# Patient Record
Sex: Female | Born: 1935 | Race: White | Hispanic: No | Smoking: Former smoker
Health system: Southern US, Community
[De-identification: ages and names within clinical notes are randomized; demographics above are authoritative.]

## PROBLEM LIST (undated history)

## (undated) DIAGNOSIS — F329 Major depressive disorder, single episode, unspecified: Secondary | ICD-10-CM

## (undated) DIAGNOSIS — M1990 Unspecified osteoarthritis, unspecified site: Secondary | ICD-10-CM

## (undated) DIAGNOSIS — G250 Essential tremor: Secondary | ICD-10-CM

## (undated) DIAGNOSIS — F3289 Other specified depressive episodes: Secondary | ICD-10-CM

## (undated) DIAGNOSIS — R269 Unspecified abnormalities of gait and mobility: Secondary | ICD-10-CM

## (undated) DIAGNOSIS — M810 Age-related osteoporosis without current pathological fracture: Secondary | ICD-10-CM

## (undated) DIAGNOSIS — J479 Bronchiectasis, uncomplicated: Secondary | ICD-10-CM

## (undated) DIAGNOSIS — S22000A Wedge compression fracture of unspecified thoracic vertebra, initial encounter for closed fracture: Secondary | ICD-10-CM

## (undated) DIAGNOSIS — G252 Other specified forms of tremor: Principal | ICD-10-CM

## (undated) DIAGNOSIS — C801 Malignant (primary) neoplasm, unspecified: Secondary | ICD-10-CM

## (undated) DIAGNOSIS — S72142A Displaced intertrochanteric fracture of left femur, initial encounter for closed fracture: Secondary | ICD-10-CM

## (undated) HISTORY — DX: Bronchiectasis, uncomplicated: J47.9

## (undated) HISTORY — PX: TONSILLECTOMY: SUR1361

## (undated) HISTORY — DX: Unspecified abnormalities of gait and mobility: R26.9

## (undated) HISTORY — DX: Malignant (primary) neoplasm, unspecified: C80.1

## (undated) HISTORY — DX: Major depressive disorder, single episode, unspecified: F32.9

## (undated) HISTORY — PX: OTHER SURGICAL HISTORY: SHX169

## (undated) HISTORY — PX: CATARACT EXTRACTION: SUR2

## (undated) HISTORY — PX: BREAST BIOPSY: SHX20

## (undated) HISTORY — DX: Other specified forms of tremor: G25.2

## (undated) HISTORY — PX: CHOLECYSTECTOMY: SHX55

## (undated) HISTORY — PX: TOTAL ABDOMINAL HYSTERECTOMY W/ BILATERAL SALPINGOOPHORECTOMY: SHX83

## (undated) HISTORY — DX: Unspecified osteoarthritis, unspecified site: M19.90

## (undated) HISTORY — DX: Age-related osteoporosis without current pathological fracture: M81.0

## (undated) HISTORY — PX: APPENDECTOMY: SHX54

## (undated) HISTORY — DX: Essential tremor: G25.0

## (undated) HISTORY — PX: MOHS SURGERY: SUR867

## (undated) HISTORY — DX: Other specified depressive episodes: F32.89

---

## 2002-08-10 ENCOUNTER — Encounter: Admission: RE | Admit: 2002-08-10 | Discharge: 2002-08-10 | Payer: Self-pay | Admitting: Geriatric Medicine

## 2002-08-10 ENCOUNTER — Encounter: Payer: Self-pay | Admitting: Geriatric Medicine

## 2002-11-02 ENCOUNTER — Encounter: Admission: RE | Admit: 2002-11-02 | Discharge: 2002-11-02 | Payer: Self-pay | Admitting: Geriatric Medicine

## 2002-11-02 ENCOUNTER — Encounter: Payer: Self-pay | Admitting: Geriatric Medicine

## 2003-08-13 ENCOUNTER — Encounter: Admission: RE | Admit: 2003-08-13 | Discharge: 2003-08-13 | Payer: Self-pay | Admitting: Geriatric Medicine

## 2003-12-11 ENCOUNTER — Encounter: Admission: RE | Admit: 2003-12-11 | Discharge: 2003-12-11 | Payer: Self-pay | Admitting: Geriatric Medicine

## 2004-08-13 ENCOUNTER — Encounter: Admission: RE | Admit: 2004-08-13 | Discharge: 2004-08-13 | Payer: Self-pay | Admitting: Geriatric Medicine

## 2004-10-02 ENCOUNTER — Encounter: Admission: RE | Admit: 2004-10-02 | Discharge: 2004-10-02 | Payer: Self-pay | Admitting: Geriatric Medicine

## 2005-02-02 ENCOUNTER — Encounter: Admission: RE | Admit: 2005-02-02 | Discharge: 2005-02-02 | Payer: Self-pay | Admitting: Neurology

## 2005-09-02 ENCOUNTER — Encounter: Admission: RE | Admit: 2005-09-02 | Discharge: 2005-09-02 | Payer: Self-pay | Admitting: Geriatric Medicine

## 2005-10-13 ENCOUNTER — Encounter: Admission: RE | Admit: 2005-10-13 | Discharge: 2005-10-13 | Payer: Self-pay | Admitting: Geriatric Medicine

## 2006-06-20 ENCOUNTER — Encounter: Admission: RE | Admit: 2006-06-20 | Discharge: 2006-06-20 | Payer: Self-pay | Admitting: Geriatric Medicine

## 2006-09-06 ENCOUNTER — Encounter: Admission: RE | Admit: 2006-09-06 | Discharge: 2006-09-06 | Payer: Self-pay | Admitting: Geriatric Medicine

## 2006-09-27 ENCOUNTER — Encounter (INDEPENDENT_AMBULATORY_CARE_PROVIDER_SITE_OTHER): Payer: Self-pay | Admitting: Cardiology

## 2006-09-27 ENCOUNTER — Ambulatory Visit (HOSPITAL_COMMUNITY): Admission: RE | Admit: 2006-09-27 | Discharge: 2006-09-27 | Payer: Self-pay | Admitting: Geriatric Medicine

## 2006-10-05 ENCOUNTER — Ambulatory Visit (HOSPITAL_COMMUNITY): Admission: RE | Admit: 2006-10-05 | Discharge: 2006-10-05 | Payer: Self-pay | Admitting: Interventional Cardiology

## 2007-09-01 ENCOUNTER — Encounter: Admission: RE | Admit: 2007-09-01 | Discharge: 2007-09-01 | Payer: Self-pay | Admitting: Geriatric Medicine

## 2007-09-07 ENCOUNTER — Encounter: Admission: RE | Admit: 2007-09-07 | Discharge: 2007-09-07 | Payer: Self-pay | Admitting: Geriatric Medicine

## 2007-09-13 ENCOUNTER — Encounter: Admission: RE | Admit: 2007-09-13 | Discharge: 2007-09-13 | Payer: Self-pay | Admitting: Geriatric Medicine

## 2008-01-11 ENCOUNTER — Encounter: Admission: RE | Admit: 2008-01-11 | Discharge: 2008-01-11 | Payer: Self-pay | Admitting: Geriatric Medicine

## 2008-01-15 ENCOUNTER — Encounter: Admission: RE | Admit: 2008-01-15 | Discharge: 2008-01-15 | Payer: Self-pay | Admitting: Geriatric Medicine

## 2008-03-12 ENCOUNTER — Encounter: Admission: RE | Admit: 2008-03-12 | Discharge: 2008-03-12 | Payer: Self-pay | Admitting: Geriatric Medicine

## 2008-04-26 ENCOUNTER — Encounter: Admission: RE | Admit: 2008-04-26 | Discharge: 2008-04-26 | Payer: Self-pay | Admitting: Geriatric Medicine

## 2008-05-08 ENCOUNTER — Encounter: Payer: Self-pay | Admitting: Internal Medicine

## 2008-05-09 ENCOUNTER — Encounter: Payer: Self-pay | Admitting: Internal Medicine

## 2008-05-10 ENCOUNTER — Encounter: Payer: Self-pay | Admitting: Internal Medicine

## 2008-07-25 ENCOUNTER — Ambulatory Visit: Payer: Self-pay | Admitting: Internal Medicine

## 2008-07-25 DIAGNOSIS — R930 Abnormal findings on diagnostic imaging of skull and head, not elsewhere classified: Secondary | ICD-10-CM

## 2008-07-25 DIAGNOSIS — J479 Bronchiectasis, uncomplicated: Secondary | ICD-10-CM

## 2008-08-05 ENCOUNTER — Encounter: Payer: Self-pay | Admitting: Internal Medicine

## 2008-08-11 DIAGNOSIS — F329 Major depressive disorder, single episode, unspecified: Secondary | ICD-10-CM

## 2008-08-11 DIAGNOSIS — M810 Age-related osteoporosis without current pathological fracture: Secondary | ICD-10-CM | POA: Insufficient documentation

## 2008-08-15 ENCOUNTER — Ambulatory Visit: Payer: Self-pay | Admitting: Internal Medicine

## 2008-09-10 ENCOUNTER — Encounter: Admission: RE | Admit: 2008-09-10 | Discharge: 2008-09-10 | Payer: Self-pay | Admitting: Geriatric Medicine

## 2008-09-23 ENCOUNTER — Ambulatory Visit: Payer: Self-pay | Admitting: Internal Medicine

## 2008-09-25 ENCOUNTER — Encounter: Admission: RE | Admit: 2008-09-25 | Discharge: 2008-09-25 | Payer: Self-pay | Admitting: Geriatric Medicine

## 2008-10-01 ENCOUNTER — Encounter: Admission: RE | Admit: 2008-10-01 | Discharge: 2008-10-01 | Payer: Self-pay | Admitting: Geriatric Medicine

## 2008-10-10 ENCOUNTER — Encounter: Payer: Self-pay | Admitting: Internal Medicine

## 2008-10-10 ENCOUNTER — Ambulatory Visit (HOSPITAL_COMMUNITY): Admission: RE | Admit: 2008-10-10 | Discharge: 2008-10-10 | Payer: Self-pay | Admitting: Internal Medicine

## 2008-10-31 ENCOUNTER — Ambulatory Visit: Payer: Self-pay | Admitting: Internal Medicine

## 2009-03-06 ENCOUNTER — Ambulatory Visit: Payer: Self-pay | Admitting: Internal Medicine

## 2009-03-27 ENCOUNTER — Encounter: Admission: RE | Admit: 2009-03-27 | Discharge: 2009-03-27 | Payer: Self-pay | Admitting: Geriatric Medicine

## 2009-07-31 ENCOUNTER — Ambulatory Visit: Payer: Self-pay | Admitting: Internal Medicine

## 2009-08-23 ENCOUNTER — Emergency Department (HOSPITAL_COMMUNITY): Admission: EM | Admit: 2009-08-23 | Discharge: 2009-08-23 | Payer: Self-pay | Admitting: Emergency Medicine

## 2009-08-27 ENCOUNTER — Encounter: Admission: RE | Admit: 2009-08-27 | Discharge: 2009-08-27 | Payer: Self-pay | Admitting: Geriatric Medicine

## 2009-09-01 ENCOUNTER — Encounter: Payer: Self-pay | Admitting: Internal Medicine

## 2009-09-05 ENCOUNTER — Telehealth (INDEPENDENT_AMBULATORY_CARE_PROVIDER_SITE_OTHER): Payer: Self-pay | Admitting: *Deleted

## 2009-09-11 ENCOUNTER — Encounter: Admission: RE | Admit: 2009-09-11 | Discharge: 2009-09-11 | Payer: Self-pay | Admitting: Geriatric Medicine

## 2009-12-29 IMAGING — CT CT CHEST W/O CM
4 of 7 series · 16 of 36 positions shown, 18 images · non-contrast
Comparison: Chest CTs 04/26/2008 and 01/15/2008.

CLINICAL DATA: Shortness of breath with exertion and chronic cough.
Follow-up bronchiectasis.

CT CHEST WITHOUT CONTRAST
TECHNIQUE: Multidetector CT imaging of the chest was performed
following the standard protocol without IV contrast.

[Series 2: hi-res 5mm · axial · 0.62mm/px · z∈[-277,-77]mm · 6 of 58 slices shown, 8 images]
[im 9/58  mediastinal]
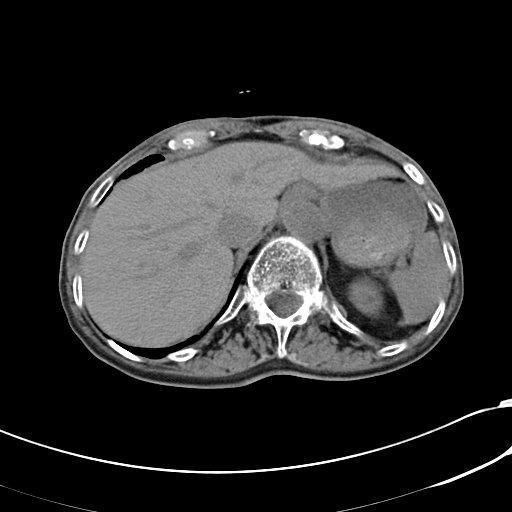
[im 9/58  lung]
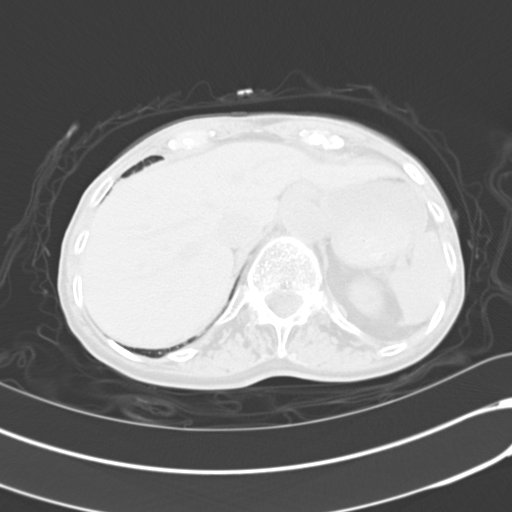
[im 17/58  lung]
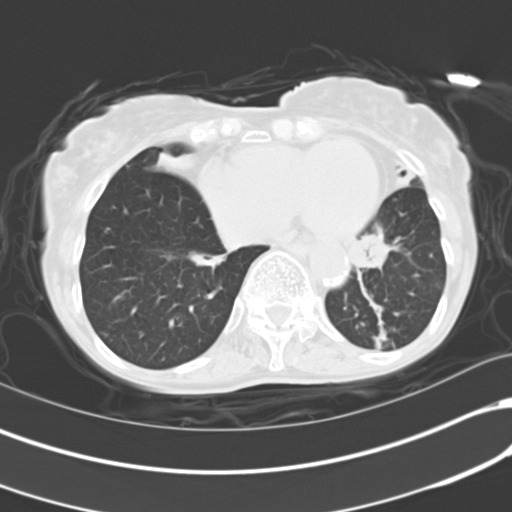
[im 25/58  lung]
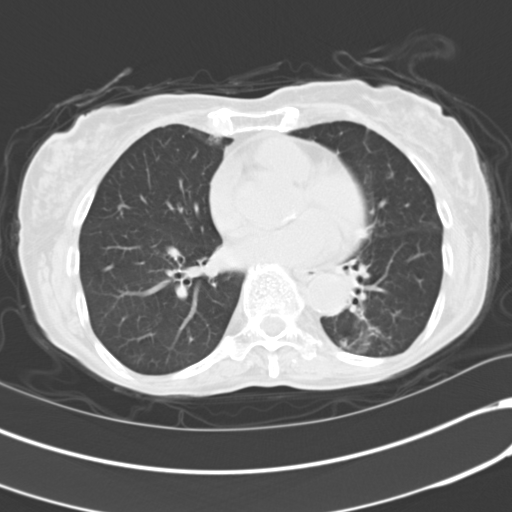
[im 33/58  lung]
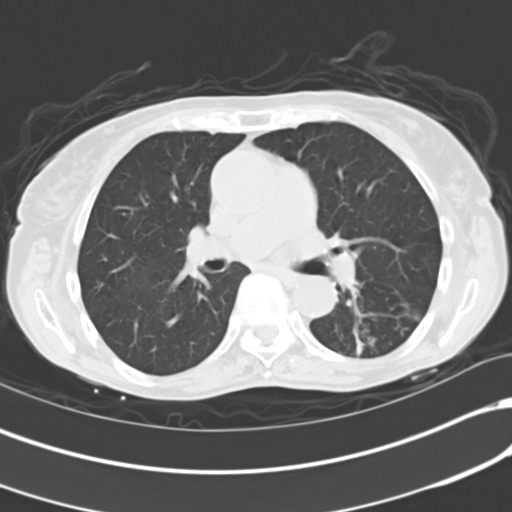
[im 41/58  mediastinal]
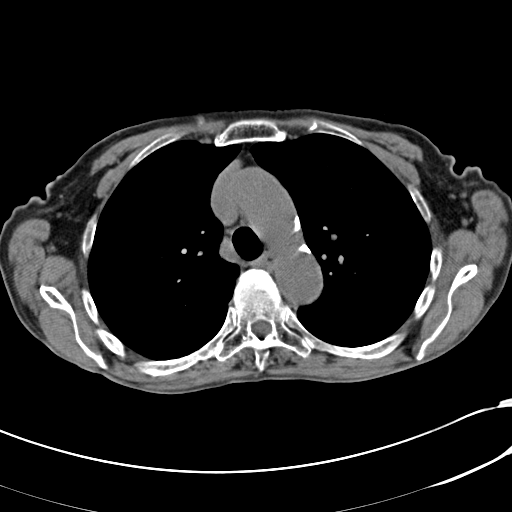
[im 41/58  lung]
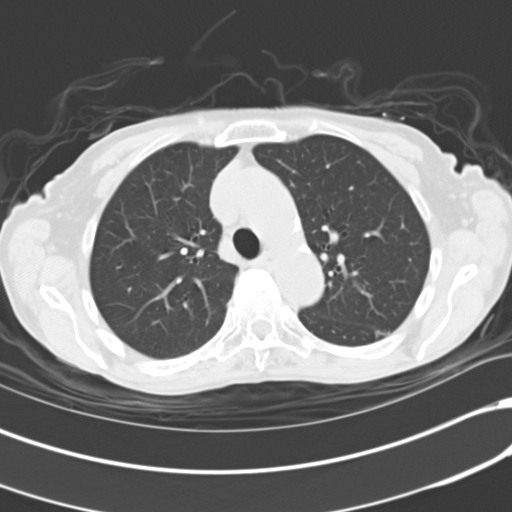
[im 49/58  lung]
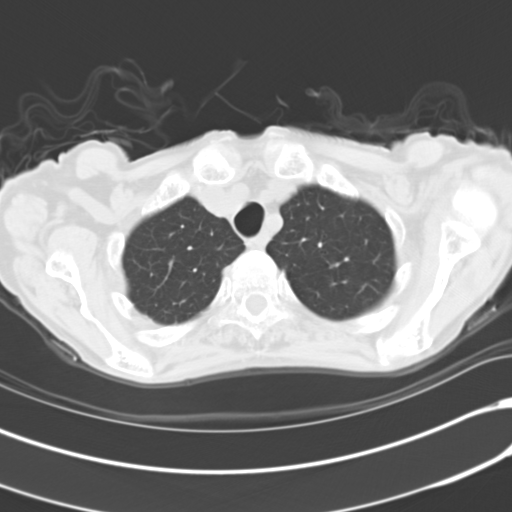

[Series 5: lung · axial · 0.62mm/px · z∈[-272,-72]mm · 6 of 56 slices shown]
[im 8/56  lung]
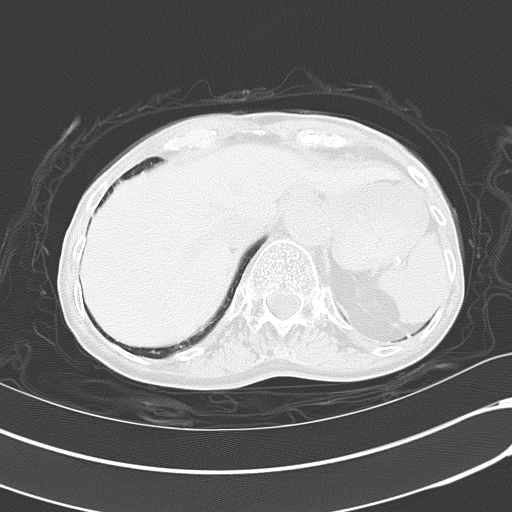
[im 16/56  lung]
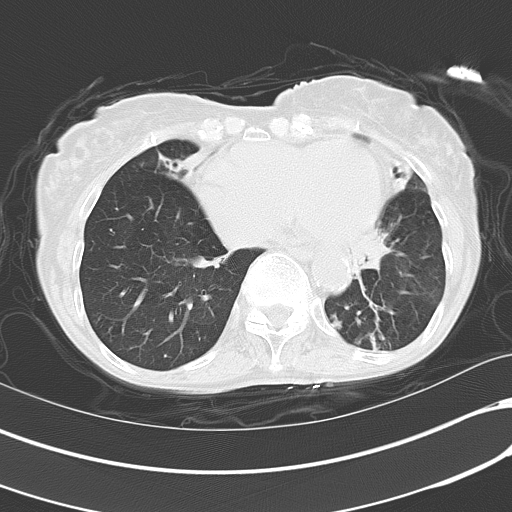
[im 24/56  lung]
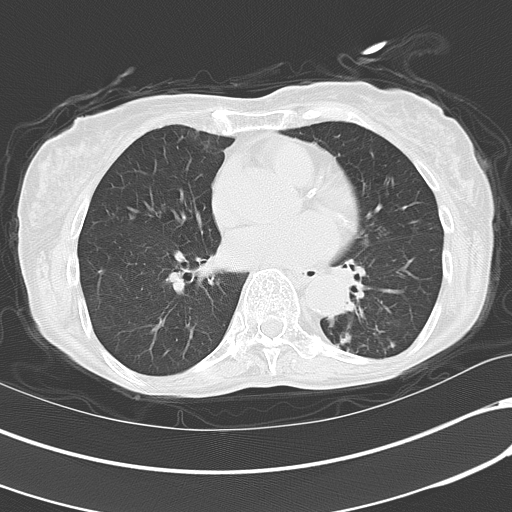
[im 32/56  lung]
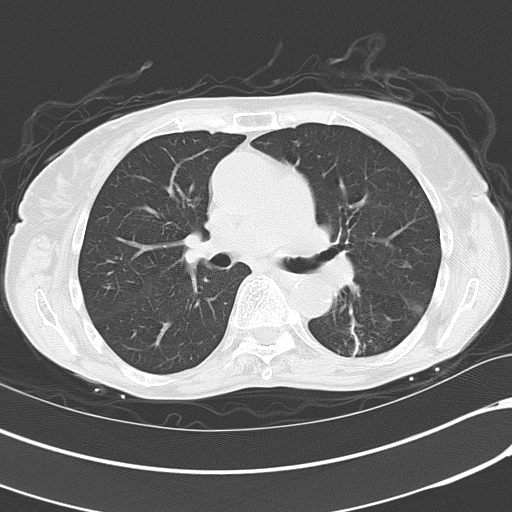
[im 40/56  lung]
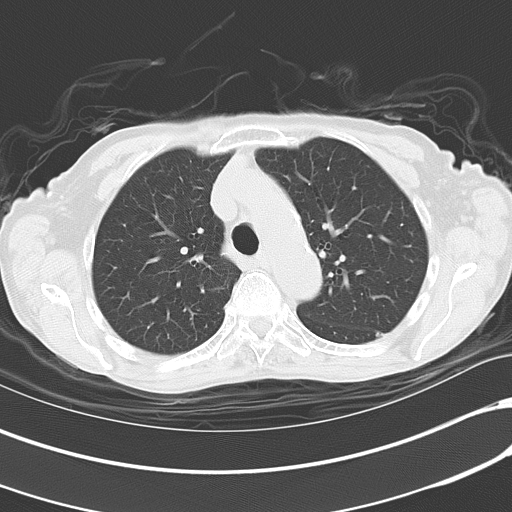
[im 48/56  lung]
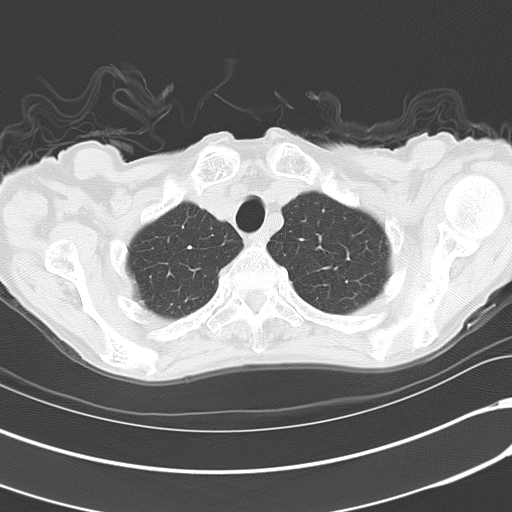

[Series 6: high res lung 1x5 · axial · 0.62mm/px · 1 of 58 slices shown]
[im 9/58  lung]
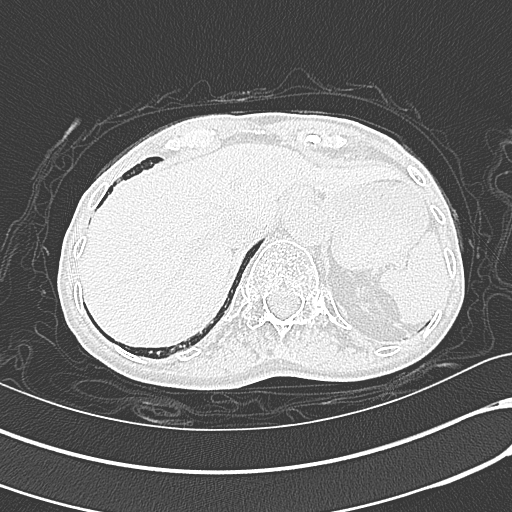

[Series 602: <mpr thick range> · coronal · 0.62mm/px · 3 of 96 slices shown]
[im 20/96  lung]
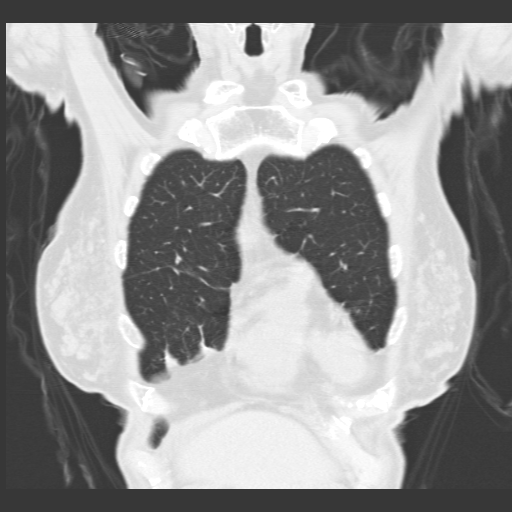
[im 39/96  lung]
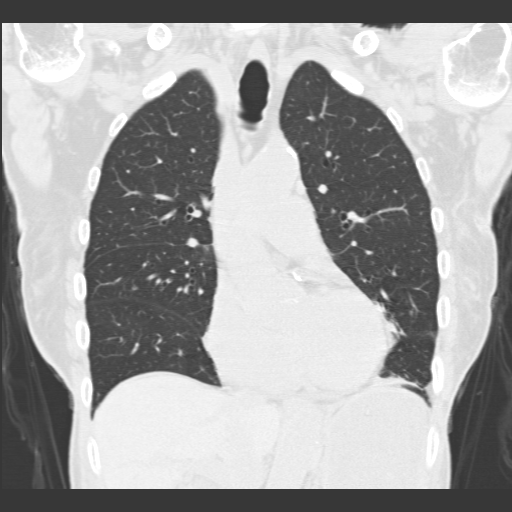
[im 58/96  lung]
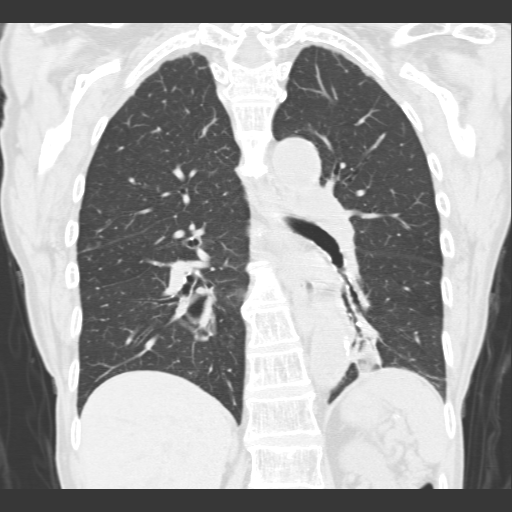

[16 of 36 positions shown; findings below may reference images not displayed]

FINDINGS: There are no enlarged mediastinal or hilar lymph nodes.
There is no pleural or pericardial effusion.  Coronary artery
disease and central enlargement of the pulmonary arteries are again
noted.

Thyroid heterogeneity and small calcifications in the right lobe
are unchanged.  There is stable tortuosity and medial displacement
of the carotid arteries at the thoracic inlet.

Bronchiectasis in the right middle lobe, lingula and left lower
lobe is again noted.  Compared with the prior study, no changes are
identified on the right.  However, there is progressive left lower
lobe bronchiectasis and surrounding inflammatory change, specially
inferior to the hilum and lateral to the descending aorta (axial
images 39-44).  A parenchymal calcification in the left lower lobe
on image 37 is unchanged from prior study, although better
visualized on the current examination due to the lack of
intravenous contrast.  This does not appear to reflect an aspirated
broncholith based on stability.

Thin section images obtained with inspiration and expiration
demonstrate scattered areas of air trapping.

There are no acute or significant findings in the upper abdomen.
IMPRESSION: 1.  Progressive bronchiectasis and peribronchial inflammatory
change in the left lower lobe.  As suggested previously, the
findings are suspicious for chronic infection, possibly atypical
mycobacterial infection.  There are scattered areas of air
trapping.
2.  The bronchiectasis and scarring in the right middle lobe and
lingula appear unchanged.
3.  No adenopathy or pleural effusion.
4.  Stable changes of pulmonary arterial hypertension.

## 2010-01-16 ENCOUNTER — Emergency Department (HOSPITAL_COMMUNITY): Admission: EM | Admit: 2010-01-16 | Discharge: 2010-01-16 | Payer: Self-pay | Admitting: Emergency Medicine

## 2010-01-29 ENCOUNTER — Encounter: Admission: RE | Admit: 2010-01-29 | Discharge: 2010-01-29 | Payer: Self-pay | Admitting: Internal Medicine

## 2010-01-29 ENCOUNTER — Ambulatory Visit: Payer: Self-pay | Admitting: Internal Medicine

## 2010-06-01 ENCOUNTER — Telehealth: Payer: Self-pay | Admitting: Internal Medicine

## 2010-06-10 ENCOUNTER — Ambulatory Visit: Payer: Self-pay | Admitting: Internal Medicine

## 2010-07-19 ENCOUNTER — Encounter: Payer: Self-pay | Admitting: Geriatric Medicine

## 2010-07-28 NOTE — Progress Notes (Signed)
Summary: Cough-requesting appointment  Phone Note Call from Patient Call back at [phone]   Caller: Daughter-Kay Call For: Young Reason for Call: Talk to Nurse Summary of Call: Kay-c/o pt having productive cough with foamy white mucus and is becoming harder for pt to bring it up. Requesting an appointment with CY, Thursdays are better for the pt, okay to leave message on voicemail. Offered appointment Jul 03, 2010 @ 9:15am but would like an appointment before then.  Initial call taken by: Zackery Barefoot CMA,  June 01, 2010 9:57 AM  Follow-up for Phone Call        [facility] Vernie Murders  June 01, 2010 10:28 AM  spoke with Joyce Gross and pt set to see CY on 06-10-10 at 10:45. Carron Curie CMA  June 01, 2010 11:46 AM

## 2010-07-28 NOTE — Letter (Signed)
Summary: [facility] Results Follow Up Letter  [facility]  [address]   [place], [place] [address]   Phone: [phone]  Fax: [phone]    09/01/2009 MRN: [id number]  [facility] [address] [place], [place]  [address]  Dear Ms. Beer,  We have received the results from your recent tests and have been unable to contact you.  Please call our office at [phone] so that Dr.Alexa Golebiewski or his nurse may review the results with you.    Thank you,      Nature conservation officer Pulmonary Division

## 2010-07-28 NOTE — Assessment & Plan Note (Signed)
Summary: 6 months/apc   Copy to:  Dr Pete Glatter Primary Provider/Referring Provider:  Pete Glatter  CC:  Follow up visit-gives out when walking long distances..  History of Present Illness: 03/06/09- Bronchieictasis, hesitant dysphonia, abnl cxr.......................daughter here. Only one hearing aid today so daughter again does much of the talking. One or two episodes of bad choking with a straw, so not using one. Denies fever or wake from sleep with cough. Daily cough with some white foam or trace yellow. Easily dypneic if she hurries, but this is not changed. She has had at least one pneumovax. Will get flu vax where she stays.  July 31, 2009- Bronchiectasis, hesitant dysphonia, abnl cxr.........................daughter here Got flu vax. Denies acute issues.They note stable or slightly worse exertional dyspnea, but deny waking at night short of breath or with cough or choke. No night sweats and little cough or phlegm. She paces herself. She complains of her weakness.  CT in 2/ 2010 showed PHTN, inflammatory scarring (MAIC?) in LLL. Daughter spoke to me aside to say they have worked with Dr Pete Glatter. Mom is depressed and being treated. Daughter agrees with conservative approach and thinks Mom is basically doing ok for age.  January 29, 2010- Bronchiectasis, hesitant dysphonia, abnl CXR........daughter here Feel 2 weeks ago- treated for laceration R browline. Did not hurt her chest. Morning cough white phlegm, then clear for day. Easy DOE walking, but she and her daughter feel there has been little change since last here. They realize the heat drains her some also.  To see her primary doctor today because right ankle is swelling a bit without pain. She did hurt right knee when she fell. She is careful to swallow well, but denies choking with meal or dirnk CXR in February showed no acvtive process. Known bronchiectasis.  Preventive Screening-Counseling & Management  Alcohol-Tobacco  Smoking Status: quit     Packs/Day: <0.25     Year Started: teenager     Year Quit: remote  Current Medications (verified): 1)  Aspirin Adult Low Strength 81 Mg Tbec (Aspirin) .Marland Kitchen.. 1 Once Daily 2)  Elder Tonic .... Once Daily By Mouth 3)  Vitamin C Cr 500 Mg Cr-Caps (Ascorbic Acid) .Marland Kitchen.. 1 Once Daily 4)  Calcium 600 1500 Mg Tabs (Calcium Carbonate) .... Take 1 Tablet By Mouth Two Times A Day 5)  Remeron 15 Mg Tabs (Mirtazapine) .... Take 1/2 By Mouth At Bedtime 6)  Alprazolam 0.25 Mg Tabs (Alprazolam) .... Take 1 By Mouth Three Times A Day As Needed  Allergies (verified): 1)  ! Codeine 2)  ! * Urecholine  Past History:  Past Medical History: Last updated: 07/25/2008 BRONCHIECTASIS (ICD-494.0) CHEST XRAY, ABNORMAL (ICD-793.1) OSTEOPOROSIS (ICD-733.00) DEPRESSION (ICD-311)  Past Surgical History: Last updated: 07/25/2008 Cataract Moh's surgery breast biopsy- B9 Right rotator cuff T A H and B S O Cholecystectomy Appendectomy hx left wrist fracture  Family History: Last updated: 07/25/2008 Father- emphysema Mother- heart Sister- cancer  Social History: Last updated: 07/25/2008 Patient states former smoker.  Lives [facility] [place] [facility]  Risk Factors: Smoking Status: quit (01/29/2010) Packs/Day: <0.25 (01/29/2010)  Social History: Packs/Day:  <0.25  Review of Systems      See HPI       The patient complains of shortness of breath with activity and productive cough.  The patient denies shortness of breath at rest, non-productive cough, coughing up blood, chest pain, irregular heartbeats, acid heartburn, indigestion, loss of appetite, weight change, abdominal pain, difficulty swallowing, sore throat, tooth/dental problems, headaches,  nasal congestion/difficulty breathing through nose, and sneezing.    Vital Signs:  Patient profile:   77 year old female Height:      65 inches Weight:      97 pounds BMI:     16.20 O2 Sat:      94 % on Room air Pulse  rate:   83 / minute BP sitting:   100 / 62  (left arm) Cuff size:   regular  Vitals Entered By: Reynaldo Minium CMA (January 29, 2010 10:37 AM)  O2 Flow:  Room air CC: Follow up visit-gives out when walking long distances.   Physical Exam  Additional Exam:  General: A/Ox3; pleasant and cooperative, NAD, elderly , thin, q SKIN: no rash, lesions NODES: no lymphadenopathy HEENT: [place]/AT, EOM- WNL, Conjuctivae- clear, PERRLA, TM-hearing aids, Nose- clear, Throat- clear and wnl, own teeth, Mallampati II, no postnasal drip. No stridor. NECK: Supple w/ fair ROM, JVD- none, normal carotid impulses w/o bruits Thyroid-  CHEST-Wet ssqueeks in bases, without cough, no dullness or rub, unlabored. Sat 94% on room air.Marland Kitchen HEART: RRR, no m/g/r heard ABDOMEN: Thin URK:YHCW, nl pulses, no edema, cyanosis or clubbing. NEURO: resting tremor with "tight" hesitant vocal quality, no stridor      CXR  Procedure date:  07/31/2009  Findings:        CHEST - 2 VIEW   Comparison: CT examination 08/15/2008.  Radiographic examination 01/11/2008.   Findings: Previous CT examination is demonstrated bronchiectasis. Linear fibrotic changes are seen in the left lower lobe.  There is some central peribronchial thickening.  Cardiac density is minimally enlarged. Ectasia and nonaneurysmal calcification of the thoracic aorta is seen. There is a mildly osteopenic appearance of the bones. On the PA image nipple densities project over the bases. There is overall hyperinflation configuration.   IMPRESSION: Previous CT examination demonstrated bronchiectasis.  There is central peribronchial thickening.  Pulmonary fibrotic changes appear stable.  No acute superimposed process is identified.  There is generalized hyperinflation configuration.   Read By:  Crawford Givens,  M.D.     Released By:  Crawford Givens,  M.D.   Impression & Recommendations:  Problem # 1:  BRONCHIECTASIS (ICD-494.0) Clinically stable, but  definite bronchiectasis. i think we can wait on CXR unless they notice a change. She is not taking respiratory meds or oxygen.   Problem # 2:  CHEST XRAY, ABNORMAL (ICD-793.1)  Known bronchiectasis with no other concern She is at some potential risk for aspiration due to age and weakness.  Had pneumovax in 2010  Medications Added to Medication List This Visit: 1)  Alprazolam 0.25 Mg Tabs (Alprazolam) .... Take 1 by mouth three times a day as needed  Other Orders: Est. Patient Level III (23762)  Patient Instructions: 1)  Please schedule a follow-up appointment in 6 months.     CXR  Procedure date:  07/31/2009  Findings:        CHEST - 2 VIEW   Comparison: CT examination 08/15/2008.  Radiographic examination 01/11/2008.   Findings: Previous CT examination is demonstrated bronchiectasis. Linear fibrotic changes are seen in the left lower lobe.  There is some central peribronchial thickening.  Cardiac density is minimally enlarged. Ectasia and nonaneurysmal calcification of the thoracic aorta is seen. There is a mildly osteopenic appearance of the bones. On the PA image nipple densities project over the bases. There is overall hyperinflation configuration.   IMPRESSION: Previous CT examination demonstrated bronchiectasis.  There is central peribronchial thickening.  Pulmonary fibrotic changes  appear stable.  No acute superimposed process is identified.  There is generalized hyperinflation configuration.   Read By:  Crawford Givens,  M.D.     Released By:  Crawford Givens,  M.D.

## 2010-07-28 NOTE — Progress Notes (Signed)
Summary: returning call for results  Phone Note Call from Patient Call back at [phone]   Caller: kay (helens daughter) Call For: young Summary of Call: patients daughter is calling because she got a letter in the mail saying that we have been trying call with results. Ms Blattner is there at her house also.  Initial call taken by: Valinda Hoar,  September 05, 2009 12:57 PM  Follow-up for Phone Call        Spoke with pt's daughter and made aware of results per cxr append.  Follow-up by: Vernie Murders,  September 05, 2009 1:51 PM

## 2010-07-28 NOTE — Assessment & Plan Note (Signed)
Summary: 4 months/apc   Copy to:  Dr Pete Glatter Primary Provider/Referring Provider:  Pete Glatter  CC:  Follow up visit-? CXR needed.Marland Kitchen  History of Present Illness: 09/23/08- Bronchiectasis, abnl cxr           Daughter does most of talking Discussed CT- Some progression over the past year of bronchiectatic changes esp in left lower lobe. All sputum specimens from [place] are neg for AFB. She says she coughs "a little".milky thick stuff.  Mostly morning cough hard to clear sense of something in her throat but once clear, she denies cough or choke with meals. Does sometimes get strangled.  10/31/08- Bronchiectasis, hesitant dysphonia    Daughter here Discussed swallowing evaluation suggesting some esophageal dysmotility and lack of awareness of pill hangup, but with low risk of aspiration. Daughter says Mom does eat slowly, small meals/ Ensure, and they show good understanding and compliance with speech therapy recommendations.Gets strangled occasionally.  03/06/09- Bronchieictasis, hesitant dysphonia, abnl cxr.......................daughter here. Only one hearing aid today so daughter again does much of the talking. One or two episodes of bad choking with a straw, so not using one. Denies fever or wake from sleep with cough. Daily cough with some white foam or trace yellow. Easily dypneic if she hurries, but this is not changed. She has had at least one pneumovax. Will get flu vax where she stays.  July 31, 2009- Bronchiectasis, hesitant dysphonia, abnl cxr.........................daughter here Got flu vax. Denies acute issues.They note stable or slightly worse exertional dyspnea, but deny waking at night short of breath or with cough or choke. No night sweats and little cough or phlegm. She paces herself. She complains of her weakness.  CT in 2/ 2010 showed PHTN, inflammatory scarring (MAIC?) in LLL. Daughter spoke to me aside to say they have worked with Dr Pete Glatter. Mom is depressed and being  treated. Daughter agrees with conservative approach and thinks Mom is basically doing ok for age.    Current Medications (verified): 1)  Topiramate 25 Mg Tabs (Topiramate) .... Take 1 By Mouth Every Morning and Take 2 By Mouth At Bedtime 2)  Aspirin Adult Low Strength 81 Mg Tbec (Aspirin) .Marland Kitchen.. 1 Once Daily 3)  Elder Tonic .... Once Daily By Mouth 4)  Vitamin C Cr 500 Mg Cr-Caps (Ascorbic Acid) .Marland Kitchen.. 1 Once Daily 5)  Calcium 600 1500 Mg Tabs (Calcium Carbonate) .... Take 1 Tablet By Mouth Two Times A Day 6)  Benzonatate 100 Mg Caps (Benzonatate) .Marland Kitchen.. 1 Every 6 Hours If Needed For Cough 7)  Remeron 15 Mg Tabs (Mirtazapine) .... Take 1/2 By Mouth At Bedtime  Allergies (verified): 1)  ! Codeine 2)  ! * Urecholine  Past History:  Past Medical History: Last updated: 07/25/2008 BRONCHIECTASIS (ICD-494.0) CHEST XRAY, ABNORMAL (ICD-793.1) OSTEOPOROSIS (ICD-733.00) DEPRESSION (ICD-311)  Past Surgical History: Last updated: 07/25/2008 Cataract Moh's surgery breast biopsy- B9 Right rotator cuff T A H and B S O Cholecystectomy Appendectomy hx left wrist fracture  Family History: Last updated: 07/25/2008 Father- emphysema Mother- heart Sister- cancer  Social History: Last updated: 07/25/2008 Patient states former smoker.  Lives [facility] [place] [facility]  Risk Factors: Smoking Status: quit (07/25/2008)  Review of Systems      See HPI       The patient complains of dyspnea on exertion.  The patient denies anorexia, fever, weight loss, weight gain, vision loss, decreased hearing, hoarseness, chest pain, syncope, peripheral edema, prolonged cough, headaches, hemoptysis, abdominal pain, and severe indigestion/heartburn.    Vital  Signs:  Patient profile:   77 year old female Height:      65 inches Weight:      101.50 pounds O2 Sat:      94 % on Room air Pulse rate:   70 / minute BP sitting:   120 / 70  (left arm) Cuff size:   regular  Vitals Entered By: Reynaldo Minium  CMA (July 31, 2009 10:06 AM)  O2 Flow:  Room air  Physical Exam  Additional Exam:  General: A/Ox3; pleasant and cooperative, NAD, elderly , thin, q SKIN: no rash, lesions NODES: no lymphadenopathy HEENT: [place]/AT, EOM- WNL, Conjuctivae- clear, PERRLA, TM-hearing aids, Nose- clear, Throat- clear and wnl, own teeth, Melampatti II, no postnasal drip. No stridor. NECK: Supple w/ fair ROM, JVD- none, normal carotid impulses w/o bruits Thyroid-  CHEST-Limited inspriatory squeeks left lower zone, without cough, no dullness or rub, unlabored. Sat 94% on room air.Marland Kitchen HEART: RRR, no m/g/r heard ABDOMEN:  ZOX:WRUE, nl pulses, no edema, cyanosis or clubbing. NEURO: resting tremor with "tight" hesitant vocal quality, no stridor      Impression & Recommendations:  Problem # 1:  BRONCHIECTASIS (ICD-494.0) Recurrent aspiration and MAIC are both possible. At age 16 we agreed that conservative management is best. We will recheck CXR, but I don't anticipate trying to treat PHTN. We are going to wait on overnight oximetry. I discussed exercise as necessary to maintain strength, balanced with eating enough to maintain weight. She expressed fear that any exercise would just cause weight loss. I tried to be gently supportive/ encouraging.  Problem # 2:  CHEST XRAY, ABNORMAL (ICD-793.1) We are updating CXR  Medications Added to Medication List This Visit: 1)  Elder Tonic  .... Once daily by mouth 2)  Remeron 15 Mg Tabs (Mirtazapine) .... Take 1/2 by mouth at bedtime  Other Orders: Est. Patient Level III (45409) T-2 View CXR (71020TC)  Patient Instructions: 1)  Please schedule a follow-up appointment in 6 months. 2)  A chest x-ray has been recommended.  Your imaging study may require preauthorization.

## 2010-07-30 ENCOUNTER — Ambulatory Visit: Payer: Self-pay | Admitting: Internal Medicine

## 2010-07-30 ENCOUNTER — Ambulatory Visit: Admit: 2010-07-30 | Payer: Self-pay | Admitting: Internal Medicine

## 2010-07-30 NOTE — Assessment & Plan Note (Signed)
Summary: increased cough and congestion//jrc   Copy to:  Dr Pete Glatter Primary Provider/Referring Provider:  Pete Glatter  CC:  Increased cough-productive thick and white-"having clumps"; (feels like its stuck in the chest).  History of Present Illness: July 31, 2009- Bronchiectasis, hesitant dysphonia, abnl cxr.........................daughter here Got flu vax. Denies acute issues.They note stable or slightly worse exertional dyspnea, but deny waking at night short of breath or with cough or choke. No night sweats and little cough or phlegm. She paces herself. She complains of her weakness.  CT in 2/ 2010 showed PHTN, inflammatory scarring (MAIC?) in LLL. Daughter spoke to me aside to say they have worked with Dr Pete Glatter. Mom is depressed and being treated. Daughter agrees with conservative approach and thinks Mom is basically doing ok for age.  January 29, 2010- Bronchiectasis, hesitant dysphonia, abnl CXR........daughter here Feel 2 weeks ago- treated for laceration R browline. Did not hurt her chest. Morning cough white phlegm, then clear for day. Easy DOE walking, but she and her daughter feel there has been little change since last here. They realize the heat drains her some also.  To see her primary doctor today because right ankle is swelling a bit without pain. She did hurt right knee when she fell. She is careful to swallow well, but denies choking with meal or dirnk CXR in February showed no active process. Known bronchiectasis.  June 10, 2010-  Bronchiectasis, hesitant dysphonia, abnl CXR........daughter here Nurse-CC: Increased cough-productive thick and white-"having clumps"; (feels like its stuck in the chest) Feels thick mucus gets stuck in chest. Occasionally coughs out small amounts. . Daughter says thick white frothy like cotton. We discussed mucinex- may need a liquid. Denies fever, chest pain, palpitation and sleeps through night, with mostly morning cough.    Preventive Screening-Counseling & Management  Alcohol-Tobacco     Smoking Status: quit     Packs/Day: <0.25     Year Started: teenager     Year Quit: remote  Current Medications (verified): 1)  Aspirin Adult Low Strength 81 Mg Tbec (Aspirin) .Marland Kitchen.. 1 Once Daily 2)  Vitamin C Cr 500 Mg Cr-Caps (Ascorbic Acid) .Marland Kitchen.. 1 Once Daily 3)  Calcium 600 1500 Mg Tabs (Calcium Carbonate) .... Take 1 Tablet By Mouth Two Times A Day 4)  Remeron 15 Mg Tabs (Mirtazapine) .... Take 1/2 By Mouth At Bedtime 5)  Alprazolam 0.25 Mg Tabs (Alprazolam) .... Take 1/2  By Mouth Three Times A Day As Needed 6)  Midodrine Hcl 2.5 Mg Tabs (Midodrine Hcl) .... Take 1po At Breakfast and Lunch Then 1 By Mouth At 4pm Everyday  Allergies (verified): 1)  ! Codeine 2)  ! * Urecholine  Past History:  Past Medical History: Last updated: 07/25/2008 BRONCHIECTASIS (ICD-494.0) CHEST XRAY, ABNORMAL (ICD-793.1) OSTEOPOROSIS (ICD-733.00) DEPRESSION (ICD-311)  Past Surgical History: Last updated: 07/25/2008 Cataract Moh's surgery breast biopsy- B9 Right rotator cuff T A H and B S O Cholecystectomy Appendectomy hx left wrist fracture  Family History: Last updated: 07/25/2008 Father- emphysema Mother- heart Sister- cancer  Social History: Last updated: 07/25/2008 Patient states former smoker.  Lives [facility] [place] [facility]  Risk Factors: Smoking Status: quit (06/10/2010) Packs/Day: <0.25 (06/10/2010)  Review of Systems      See HPI       The patient complains of shortness of breath with activity and productive cough.  The patient denies shortness of breath at rest, coughing up blood, chest pain, irregular heartbeats, acid heartburn, indigestion, loss of appetite, weight change, abdominal  pain, difficulty swallowing, sore throat, tooth/dental problems, headaches, nasal congestion/difficulty breathing through nose, and sneezing.    Vital Signs:  Patient profile:   77 year old female Height:       65 inches Weight:      100.13 pounds BMI:     16.72 O2 Sat:      95 % on Room air Pulse rate:   67 / minute BP sitting:   108 / 62  (left arm) Cuff size:   regular  Vitals Entered By: Reynaldo Minium CMA (June 10, 2010 11:06 AM)  O2 Flow:  Room air CC: Increased cough-productive thick and white-"having clumps"; (feels like its stuck in the chest)   Physical Exam  Additional Exam:  General: A/Ox3; pleasant and cooperative, NAD, elderly , thin,  SKIN: no rash, lesions NODES: no lymphadenopathy HEENT: [place]/AT, EOM- WNL, Conjuctivae- clear, PERRLA, TM-hearing aids, Nose- clear, Throat- clear and wnl, own teeth, Mallampati II, no postnasal drip. No stridor. NECK: Supple w/ fair ROM, JVD- none, normal carotid impulses w/o bruits Thyroid-  CHEST-Wet ssqueeks diffusely, unlabored, without cough, no dullness or rub, unlabored. Sat 95% on room air.Marland Kitchen HEART: RRR, no m/g/r heard ABDOMEN: Thin WUJ:WJXB, nl pulses, no edema, cyanosis or clubbing. NEURO: resting tremor with "tight" hesitant vocal quality, no stridor      Impression & Recommendations:  Problem # 1:  BRONCHIECTASIS (ICD-494.0) Indoor heat is adding to dryness issues, but there doesn't seem to be an active infection now for [place] to target. We discussed humidifier, mucinex. Daughter would like to try liquid first, anticipating she will swallow it better than pill. .  We reviewed CXR from last February and will wait on repeat for now. Continuing efforts at pulmonary toilet and aspiration precautions.   Medications Added to Medication List This Visit: 1)  Alprazolam 0.25 Mg Tabs (Alprazolam) .... Take 1/2  by mouth three times a day as needed 2)  Midodrine Hcl 2.5 Mg Tabs (Midodrine hcl) .... Take 1po at breakfast and lunch then 1 by mouth at 4pm everyday  Other Orders: Est. Patient Level III (14782)  Patient Instructions: 1)  Please schedule a follow-up appointment in 6 months. Please call sooner as needed. 2)  Try guaifenesin  as a mucus thinner- 3)    Plain Robitussin- 1 tablespoon 3 times daily after meals for a one week trial    or..... 4)    Mucinex 600 mx twice daily. 5)  If helpful, you can start or stop this on your own when needed.  6)  Be sure to drink enough liquids.

## 2010-07-31 ENCOUNTER — Telehealth: Payer: Self-pay | Admitting: Internal Medicine

## 2010-08-05 NOTE — Progress Notes (Signed)
Summary: nos appt  Phone Note Call from Patient   Caller: [email or web address]  Call For: young Summary of Call: Rsc nos from 2/2 to 6/14. Initial call taken by: Darletta Moll,  July 31, 2010 10:49 AM

## 2010-09-16 LAB — DIFFERENTIAL
Basophils Relative: 1 % (ref 0–1)
Lymphs Abs: 1.3 10*3/uL (ref 0.7–4.0)
Monocytes Absolute: 1 10*3/uL (ref 0.1–1.0)
Monocytes Relative: 17 % — ABNORMAL HIGH (ref 3–12)
Neutro Abs: 3.4 10*3/uL (ref 1.7–7.7)
Neutrophils Relative %: 59 % (ref 43–77)

## 2010-09-16 LAB — CBC
Hemoglobin: 11.4 g/dL — ABNORMAL LOW (ref 12.0–15.0)
MCHC: 34.1 g/dL (ref 30.0–36.0)
MCV: 95 fL (ref 78.0–100.0)
RBC: 3.52 MIL/uL — ABNORMAL LOW (ref 3.87–5.11)
WBC: 5.8 10*3/uL (ref 4.0–10.5)

## 2010-10-22 ENCOUNTER — Other Ambulatory Visit: Payer: Self-pay | Admitting: Geriatric Medicine

## 2010-10-22 DIAGNOSIS — R109 Unspecified abdominal pain: Secondary | ICD-10-CM

## 2010-10-23 ENCOUNTER — Ambulatory Visit
Admission: RE | Admit: 2010-10-23 | Discharge: 2010-10-23 | Disposition: A | Discharge status: Home / Self Care | Payer: Medicare Other | Source: Ambulatory Visit | Attending: Geriatric Medicine | Admitting: Geriatric Medicine

## 2010-10-23 DIAGNOSIS — R109 Unspecified abdominal pain: Secondary | ICD-10-CM

## 2010-10-23 MED ORDER — IOHEXOL 300 MG/ML  SOLN
75.0000 mL | Freq: Once | INTRAMUSCULAR | Status: AC | PRN
  Administered 2010-10-23: 75 mL via INTRAVENOUS

## 2010-12-07 ENCOUNTER — Encounter: Payer: Self-pay | Admitting: Internal Medicine

## 2010-12-10 ENCOUNTER — Ambulatory Visit (INDEPENDENT_AMBULATORY_CARE_PROVIDER_SITE_OTHER): Payer: Medicare Other | Admitting: Internal Medicine

## 2010-12-10 ENCOUNTER — Encounter: Payer: Self-pay | Admitting: Internal Medicine

## 2010-12-10 VITALS — BP 110/60 | HR 68 | Ht 65.0 in | Wt 97.8 lb

## 2010-12-10 DIAGNOSIS — J479 Bronchiectasis, uncomplicated: Secondary | ICD-10-CM

## 2010-12-10 NOTE — Patient Instructions (Signed)
Ok to continue Robitussin when needed

## 2010-12-10 NOTE — Progress Notes (Signed)
  Subjective:    Patient ID: Nancy Ponce, female    DOB: Apr 07, 1921, 76 y.o.   MRN: [id number]  HPI 12/10/10- 84 yo Former smoker Bronchiectasis, abnormal CXR     Daughter here   Hard of hearing. Last here June 10, 2010.  Since last here says she is coughing a lot and still spitting up. Saw Dr Pete Glatter 3 weeks ago- told no change. Daughter says cough is unchanged over the year. More easily DOE walking gradually, room to room. Coughs thick mucus, sometimes cough is dry. Using Robitusin at times- seems to help. They don't notice a lot of choking with food or drink.  Denies purulent or bloody sputum, fever, marked dyspnea or acute change.   Review of Systems Constitutional:   No- weight loss, night sweats,  Fevers, chills, fatigue, lassitude. HEENT:   No headaches,  Difficulty swallowing,  Tooth/dental problems,  Sore throat,                No sneezing, itching, ear ache, nasal congestion, post nasal drip,   CV:  No chest pain,  Orthopnea, PND, swelling in lower extremities, anasarca, dizziness, palpitations  GI  No heartburn, indigestion, abdominal pain, nausea, vomiting, diarrhea, change in bowel habits, loss of appetite  Resp: No acute shortness of breath with exertion or at rest.    No coughing up of blood.  No change in color of mucus.  No wheezing.  Skin: no rash or lesions.  GU: no dysuria, change in color of urine, no urgency or frequency.  No flank pain.  MS:  No joint pain or swelling.  No decreased range of motion.  No back pain.  Psych:  No change in mood or affect. No depression or anxiety.  No memory loss.      Objective:   Physical Exam General- Alert, Oriented, Affect-appropriate, Distress- none acute    Thin, elderly woman with depressed affect  Skin- rash-none, lesions- none, excoriation- none  Lymphadenopathy- none  Head- atraumatic  Eyes- Gross vision intact, PERRLA, conjunctivae clear,secretions  Ears- Hearing- quite hard of hearing Nose- Clear,  No-Septal dev, mucus, polyps, erosion, perforation   Throat- Mallampati II , mucosa clear , drainage- none, tonsils- atrophic  Neck- flexible , trachea midline, no stridor , thyroid nl, carotid no bruit  Chest - symmetrical excursion , unlabored     Heart/CV- RRR , no murmur , no gallop  , no rub, nl s1 s2                     - JVD- none , edema- none, stasis changes- none, varices- none     Lung- Diffuse rhonchi ,     dullness-none, rub- none   unlabored     Chest wall-   Abd- tender-no, distended-no, bowel sounds-present, HSM- no  Br/ Gen/ Rectal- Not done, not indicated  Extrem- cyanosis- none, clubbing, none, atrophy- none, strength- nl  Neuro- grossly intact to observation         Assessment & Plan:

## 2010-12-10 NOTE — Assessment & Plan Note (Addendum)
Clinically this is c/w stable bronchiectasis. There is some variability over time.. Daughter seems comfortable responding with otc Robitussin and credits it with preventing significant infection this year.  Intermittent aspiration is highly likely. Swallowing eval done in the past.  MAIC is possible, but CXR reportedly stable.  She would not tolerate Daliresp related GI  distress.

## 2011-12-16 ENCOUNTER — Ambulatory Visit: Payer: Medicare Other | Admitting: Internal Medicine

## 2013-01-02 ENCOUNTER — Other Ambulatory Visit: Payer: Self-pay

## 2013-01-02 MED ORDER — ALPRAZOLAM 0.25 MG PO TABS: ORAL_TABLET | ORAL | Status: DC

## 2013-02-05 ENCOUNTER — Other Ambulatory Visit: Payer: Self-pay

## 2013-02-05 ENCOUNTER — Ambulatory Visit (INDEPENDENT_AMBULATORY_CARE_PROVIDER_SITE_OTHER)
Admission: RE | Admit: 2013-02-05 | Discharge: 2013-02-05 | Disposition: A | Discharge status: Home / Self Care | Payer: Medicare Other | Source: Ambulatory Visit | Attending: Internal Medicine | Admitting: Internal Medicine

## 2013-02-05 ENCOUNTER — Encounter: Payer: Self-pay | Admitting: Internal Medicine

## 2013-02-05 ENCOUNTER — Other Ambulatory Visit: Payer: Medicare Other

## 2013-02-05 ENCOUNTER — Ambulatory Visit (INDEPENDENT_AMBULATORY_CARE_PROVIDER_SITE_OTHER): Payer: Medicare Other | Admitting: Internal Medicine

## 2013-02-05 VITALS — BP 139/70 | HR 93 | Ht 63.0 in | Wt 93.5 lb

## 2013-02-05 DIAGNOSIS — J479 Bronchiectasis, uncomplicated: Secondary | ICD-10-CM

## 2013-02-05 MED ORDER — ALPRAZOLAM 0.25 MG PO TABS: ORAL_TABLET | ORAL | Status: DC

## 2013-02-05 MED ORDER — CLARITHROMYCIN 250 MG PO TABS: ORAL_TABLET | ORAL | Status: DC

## 2013-02-05 NOTE — Telephone Encounter (Signed)
Rx signed and faxed.

## 2013-02-05 NOTE — Progress Notes (Signed)
02/05/13- [name] former smoker with hx COPD- ACUTE VISIT:LAST SEEN 2012;havin cough for about 3 years and getting worse; has had cough med and not helping. Dr Pete Glatter also stated that patient has been losing weight.                              Daughter here-  states that phelgm is normally green in color. Known bronchiectasis. Sputum culture 2012 neg for AFB. Now concerned with 8 lb weight loss in 3 months, increased cough with green sputum every time she coughs. No blood or fever. Denies reflux or choking with swallow.   ROS-see HPI Constitutional:   + weight loss,No- night sweats, fevers, chills, fatigue, lassitude. HEENT:   No-  headaches, difficulty swallowing, tooth/dental problems, sore throat,       No-  sneezing, itching, ear ache, nasal congestion, post nasal drip,  CV:  No-   chest pain, orthopnea, PND, swelling in lower extremities, anasarca,  dizziness, palpitations Resp: +  shortness of breath with exertion or at rest.              + productive cough,  No non-productive cough,  No- coughing up of blood.              +change in color of mucus.  No- wheezing.   Skin: No-   rash or lesions. GI:  No-   heartburn, indigestion, abdominal pain, nausea, vomiting, GU:  MS:  No-   joint pain or swelling.  . Neuro-     nothing unusual Psych:  No- change in mood or affect. No depression or anxiety.  No memory loss.  OBJ- Physical Exam General- Alert, Oriented, Affect-appropriate, Distress- none acute. +Very elderly, frail, walker, thin Skin- rash-none, lesions- none, excoriation- none Lymphadenopathy- none Head- atraumatic            Eyes- Gross vision intact, PERRLA, conjunctivae and secretions clear            Ears- Hearing adequate            Nose- Clear, no-Septal dev, mucus, polyps, erosion, perforation             Throat- Mallampati II , mucosa clear , drainage- none, tonsils- atrophic Neck- flexible , trachea midline, no stridor , thyroid nl, carotid no bruit Chest -  symmetrical excursion , unlabored           Heart/CV- RRR , no murmur , no gallop  , no rub, nl s1 s2                           - JVD- none , edema- none, stasis changes- none, varices- none           Lung- +distant scattered crackles/ fine rhonchi, wheeze- none, cough- none , dullness-none, rub- none           Chest wall-  Abd- tender-no, distended-no, bowel sounds-present, HSM- no Br/ Gen/ Rectal- Not done, not indicated Extrem- cyanosis- none, clubbing, none, atrophy- none, strength- weak and frail Neuro- grossly intact to observation. Mumbling soft voice

## 2013-02-05 NOTE — Patient Instructions (Addendum)
Order- CXR- dx bronchiectasis  Order- lab- Sputum smear and culture  Routine, AFB, fungal  Script sent to [facility] for biaxin/ clarithromycin antibiotic. Take 1 after each breakfast and supper for 1 week

## 2013-02-05 NOTE — Assessment & Plan Note (Signed)
Probable subacute bronchitic exacerbation of chronic bronchiectasis. Cachexia of chronic disease, with underlying malignancy not excluded. Plan- CXR for file comparison, sputum cultures, biaxin- age adjusted dose.

## 2013-02-06 ENCOUNTER — Telehealth: Payer: Self-pay | Admitting: Internal Medicine

## 2013-02-06 NOTE — Progress Notes (Signed)
Quick Note:  dtr aware ______

## 2013-02-06 NOTE — Telephone Encounter (Signed)
Notes Recorded by Waymon Budge, MD on 02/05/2013 at 5:20 PM CXR- mostly the chronic bronchiectasis and scarring. There is a small area that might be a little bit of pneumonia. We will repeat the CXR later on, after her antibiotic.  Sputum not back yet, not final  Spoke with the pt's daughter, Joyce Gross and notified of cxr results She verbalized understanding and denied any questions.

## 2013-02-08 LAB — RESPIRATORY CULTURE OR RESPIRATORY AND SPUTUM CULTURE

## 2013-02-09 ENCOUNTER — Telehealth: Payer: Self-pay | Admitting: Internal Medicine

## 2013-02-09 MED ORDER — DOXYCYCLINE HYCLATE 100 MG PO TABS: ORAL_TABLET | ORAL | Status: DC

## 2013-02-09 NOTE — Telephone Encounter (Signed)
I spoke with Daughter Joyce Gross. She stated pt started the biaxin Tuesday night, took 2 on wed, and 1 yesterday morning. Per daughter pt called her wed and told her she was very fatigue. Then daughter saw pt yesterday and she was very lethargic and could hardley move d/t this. Pt felt it was d/t the ABX bc she did not feel this way until she started this medication. Pt did not take any last night or today and pt is reporting her fatigue has improved. She was giving this based off CXR results w/ small area that is thought to be PNA. She is to have repeat CXR after she finishes ABX. Please advise Dr. Maple Hudson if ABX needs to be changed or if she needs to continue this. Thanks Last OV 02/05/13  Allergies  Allergen Reactions  . Codeine

## 2013-02-09 NOTE — Telephone Encounter (Signed)
Pt's daughter is aware of CY recs. Rx has been sent in. Nothing further was needed at this time.

## 2013-02-09 NOTE — Telephone Encounter (Signed)
Per CY-try changing to Doxycycline 100 mg #8 take 2 today then 1 daily no refills.

## 2013-03-05 LAB — FUNGUS CULTURE W SMEAR: Smear Result: NONE SEEN

## 2013-03-15 ENCOUNTER — Encounter: Payer: Self-pay | Admitting: Internal Medicine

## 2013-03-15 ENCOUNTER — Ambulatory Visit (INDEPENDENT_AMBULATORY_CARE_PROVIDER_SITE_OTHER): Payer: Medicare Other | Admitting: Internal Medicine

## 2013-03-15 ENCOUNTER — Ambulatory Visit (INDEPENDENT_AMBULATORY_CARE_PROVIDER_SITE_OTHER)
Admission: RE | Admit: 2013-03-15 | Discharge: 2013-03-15 | Disposition: A | Discharge status: Home / Self Care | Payer: Medicare Other | Source: Ambulatory Visit | Attending: Internal Medicine | Admitting: Internal Medicine

## 2013-03-15 VITALS — BP 116/74 | HR 85 | Ht 63.0 in | Wt 91.2 lb

## 2013-03-15 DIAGNOSIS — J479 Bronchiectasis, uncomplicated: Secondary | ICD-10-CM

## 2013-03-15 DIAGNOSIS — K219 Gastro-esophageal reflux disease without esophagitis: Secondary | ICD-10-CM

## 2013-03-15 NOTE — Assessment & Plan Note (Addendum)
Probable aspiration bronchopneumonia with GI organism E.coli and lingular infiltrate on CXR 8/11. Responded to doxy. Now baseline. Plan- CXR, emphasize reflux precautions. OK to f/u w/ Dr Pete Glatter and return prn.

## 2013-03-15 NOTE — Progress Notes (Signed)
02/05/13- [name] former smoker with hx COPD- ACUTE VISIT:LAST SEEN 2012;having cough for about 3 years and getting worse; has had cough med and not helping. Dr Pete Glatter also stated that patient has been losing weight.                              Daughter here-  states that phelgm is normally green in color. Known bronchiectasis. Sputum culture 2012 neg for AFB. Now concerned with 8 lb weight loss in 3 months, increased cough with green sputum every time she coughs. No blood or fever. Denies reflux or choking with swallow.   03/15/13- [name] former smoker with hx COPD/ bronchiectasis, weight loss    Dr Pete Glatter   Dtr here. FOLLOWS FOR:  Cough and mucus cleared up since last OV, feeling much better At last vist offered biaxin but too much drug interaction wo changed to doxy which cleared her cough and sputum quickly. Subsequently sputum cx POS E.coli. Probability of reflux/ aspiration discussed. They do notice choking with swallow. She had MBS. speech path 2010. Now feels fine-baseline. Gets flu vax at [facility]. Sputum Cx 02/05/13 was pos E.coli, broadly sensitive. CXR 02/05/13 IMPRESSION:  No pulmonary edema. Thoracic spine osteopenia again noted. Stable  hyperinflation and chronic bronchiectasis changes especially in the  lower lobes. There is streaky airspace disease in the lingula.  Superimposed infiltrate cannot be excluded. Clinical correlation  is necessary. Follow-up examination is recommended to assure  resolution.  Original Report Authenticated By: Natasha Mead, M.D.  ROS-see HPI Constitutional:   + weight loss,No- night sweats, fevers, chills, fatigue, lassitude. HEENT:   No-  headaches, difficulty swallowing, tooth/dental problems, sore throat,       No-  sneezing, itching, ear ache, nasal congestion, post nasal drip,  CV:  No-   chest pain, orthopnea, PND, swelling in lower extremities, anasarca,  dizziness, palpitations Resp: +  shortness of breath with exertion or at rest.           + productive cough,  No non-productive cough,  No- coughing up of blood.              No-change in color of mucus.  No- wheezing.   Skin: No-   rash or lesions. GI:  No-   heartburn, indigestion, abdominal pain, nausea, vomiting, GU:  MS:  No-   joint pain or swelling.  . Neuro-     nothing unusual Psych:  No- change in mood or affect. No depression or anxiety.  No memory loss.  OBJ- Physical Exam General- Alert, Oriented, Affect-appropriate, Distress- none acute. +Very elderly, frail, walker, thin Skin- rash-none, lesions- none, excoriation- none Lymphadenopathy- none Head- atraumatic            Eyes- Gross vision intact, PERRLA, conjunctivae and secretions clear            Ears- Hearing adequate            Nose- Clear, no-Septal dev, mucus, polyps, erosion, perforation             Throat- Mallampati II , mucosa clear , drainage- none, tonsils- atrophic Neck- flexible , trachea midline, no stridor , thyroid nl, carotid no bruit Chest - symmetrical excursion , unlabored           Heart/CV- RRR , no murmur , no gallop  , no rub, nl s1 s2                           -  JVD- none , edema- none, stasis changes- none, varices- none           Lung- +distant scattered crackles/ fine rhonchi, wheeze- none, cough- none , dullness-none, rub- none           Chest wall-  Abd- tender-no, distended-no, bowel sounds-present, HSM- no Br/ Gen/ Rectal- Not done, not indicated Extrem- cyanosis- none, clubbing, none, atrophy- none, strength- weak and frail, walker Neuro- +Masked facies, tremor

## 2013-03-15 NOTE — Patient Instructions (Addendum)
Order- CXR  Dx bronchiectasis  Please call as needed

## 2013-03-15 NOTE — Assessment & Plan Note (Signed)
Likely basis for bronchiectasis and pneumonia due to recurrent aspiration, although she is unaware. Plan- re-emphasize reflux and swallowing precautions.

## 2013-03-16 ENCOUNTER — Ambulatory Visit: Payer: Medicare Other | Admitting: Internal Medicine

## 2013-03-16 NOTE — Progress Notes (Signed)
Quick Note:  Advised pt's daughter of CXR results and she verbalize understanding and had no further questions ______

## 2013-03-22 NOTE — Progress Notes (Signed)
Quick Note:  Attempted to call x1 ______ 

## 2013-04-13 ENCOUNTER — Telehealth: Payer: Self-pay | Admitting: Neurology

## 2013-04-13 NOTE — Telephone Encounter (Signed)
Daughter says patient's tremors have become worse and she would like her to be seen before next OV on 05/17/13. Line busy.

## 2013-04-16 NOTE — Telephone Encounter (Signed)
Scheduled patient 10/21 @10 :30

## 2013-04-16 NOTE — Telephone Encounter (Signed)
I called the daughter. The patient apparently has had a recent change in her tremors, they have apparently gotten a work in slight for tomorrow, I will see her then.

## 2013-04-17 ENCOUNTER — Encounter: Payer: Self-pay | Admitting: Neurology

## 2013-04-17 ENCOUNTER — Ambulatory Visit (INDEPENDENT_AMBULATORY_CARE_PROVIDER_SITE_OTHER): Payer: Medicare Other | Admitting: Neurology

## 2013-04-17 VITALS — BP 142/74 | HR 103 | Wt 92.0 lb

## 2013-04-17 DIAGNOSIS — R269 Unspecified abnormalities of gait and mobility: Secondary | ICD-10-CM

## 2013-04-17 DIAGNOSIS — G252 Other specified forms of tremor: Secondary | ICD-10-CM | POA: Insufficient documentation

## 2013-04-17 DIAGNOSIS — G250 Essential tremor: Secondary | ICD-10-CM

## 2013-04-17 HISTORY — DX: Unspecified abnormalities of gait and mobility: R26.9

## 2013-04-17 HISTORY — DX: Essential tremor: G25.0

## 2013-04-17 NOTE — Progress Notes (Signed)
Reason for visit: Tremor  Nancy Ponce is an 79 y.o. female  History of present illness:  Nancy Ponce is a 79 year old right-handed white female with a history of a benign essential tremor associated with a vocal tremor, head and neck tremor, and upper extremity tremor. The patient recently moved to a memory disorders unit in an extended care facility. The patient has noted an increase in the tremors of the arms, making it difficult for her to get through a meal. Upon review of the medical records, the timing of the alprazolam was changed from being given before meals to after lunch and after dinner. The patient is also being treated for problems with her anxiety. The patient was on Seroquel, and this has been discontinued recently. The patient comes to this office for an evaluation. The patient walks with a walker, and she has not had any falls. The patient does have some problems with memory.  Past Medical History  Diagnosis Date  . Bronchiectasis without acute exacerbation   . Osteoporosis, unspecified   . Depressive disorder, not elsewhere classified   . Essential and other specified forms of tremor 04/17/2013  . Abnormality of gait 04/17/2013  . Degenerative arthritis   . Cancer     Basal cell carcinoma, right face    Past Surgical History  Procedure Laterality Date  . Breast biopsy    . Rotator cuff    . Total abdominal hysterectomy w/ bilateral salpingoophorectomy    . Cholecystectomy    . Appendectomy    . Mohs surgery    . Tonsillectomy    . Cataract extraction Bilateral   . Rotator cuff tear repair Right     Family History  Problem Relation Age of Onset  . Emphysema Father   . Heart disease Mother   . Stroke Mother   . Cancer Sister   . Diabetes Sister     Social history:  reports that she quit smoking about 60 years ago. She does not have any smokeless tobacco history on file. She reports that she does not drink alcohol or use illicit drugs.    Allergies   Allergen Reactions  . Codeine     Medications:  Current Outpatient Prescriptions on File Prior to Visit  Medication Sig Dispense Refill  . ALPRAZolam (XANAX) 0.25 MG tablet Take 1/2 by mouth three times a day as needed  45 tablet  5  . aspirin 81 MG tablet Take 81 mg by mouth daily.        . megestrol (MEGACE) 400 MG/10ML suspension Take 200 mg by mouth daily.      . midodrine (PROAMATINE) 2.5 MG tablet Take 2.5 mg by mouth 3 (three) times daily.        . mirtazapine (REMERON) 15 MG tablet Take 7.5 mg by mouth at bedtime.         No current facility-administered medications on file prior to visit.    ROS:  Out of a complete 14 system review of symptoms, the patient complains only of the following symptoms, and all other reviewed systems are negative.  Weight loss Hearing loss Memory loss Tremor Depression, anxiety, change in appetite, and disinterest in activities  Blood pressure 142/74, pulse 103, weight 92 lb (41.731 kg).  Physical Exam  General: The patient is alert and cooperative at the time of the examination.  Skin: No significant peripheral edema is noted.   Neurologic Exam  Mental status: The Mini-Mental status examination reveals a total score of  15/30.  Cranial nerves: Facial symmetry is present. Speech is associated with a vocal tremor. Extraocular movements are full. Visual fields are full. A head and neck tremor is also present.  Motor: The patient has good strength in all 4 extremities.  Sensory examination: Soft touch sensation on the face, arms, and legs were symmetric.  Coordination: The patient has good finger-nose-finger and heel-to-shin bilaterally. The patient has a tremor with finger-nose-finger.  Gait and station: The patient has a slightly unsteady gait, the patient walks with a walker tandem gait was not attempted. Romberg is negative. No drift is seen.  Reflexes: Deep tendon reflexes are symmetric, but are  depressed.   Assessment/Plan:  1. Benign essential tremor  2. Gait disorder  3. Memory disorder  The patient will be placed on alprazolam taking one half of a 0.25 milligram tablet before meals, 3 times daily. This should help her ability to get through the meal without increased tremor. The patient will followup in 6-8 months.  Marlan Palau MD 04/17/2013 8:03 PM  [facility] [address] [place], [place] [address]  Phone [phone] Fax [phone]

## 2013-05-17 ENCOUNTER — Ambulatory Visit: Payer: Self-pay | Admitting: Neurology

## 2013-06-13 ENCOUNTER — Telehealth: Payer: Self-pay | Admitting: Neurology

## 2013-06-13 MED ORDER — METOPROLOL SUCCINATE ER 25 MG PO TB24: 25.0000 mg | ORAL_TABLET | Freq: Every day | ORAL | Status: DC

## 2013-06-13 NOTE — Telephone Encounter (Signed)
TREMORS HAVE INCREASED--CAN XANAX BE INCREASED OR DOES A STRONGER RX NEED TO BE CALL IN

## 2013-06-13 NOTE — Telephone Encounter (Signed)
Patient returning call. Please call the patient.

## 2013-06-13 NOTE — Telephone Encounter (Signed)
Please advise 

## 2013-06-13 NOTE — Telephone Encounter (Signed)
Patient's daughter called to request that Dr. Anne Hahn fax to her mother's care center [facility]) the directions for her medication. The fax number is [phone]. If there are questions, please call.

## 2013-06-13 NOTE — Telephone Encounter (Signed)
Called patient and spoke with patient's daughter Joyce Gross and she stated that the nursing home [facility], needed the prescription order for metoprolol succinate (Toprol), faxed order to [facility] at [phone]. I advised the patient's daughter that if the patient has any other problems, questions or concerns to call the office. Patient's daughter verbalized understanding.

## 2013-06-13 NOTE — Telephone Encounter (Signed)
I called the patient, talked with the daughter. The patient is having increasing problems with tremor, and she has had some worsening balance issues. The patient is on alprazolam taking one half of a 0.25 mg tablet 3 times daily before meals. In the past, this has helped her be able to feed herself. The patient will be placed on a very low dose metoprolol tablet, Toprol at 25 mg at night. They are to watch out for increased fatigue, depression, and dizziness.

## 2013-06-14 ENCOUNTER — Other Ambulatory Visit: Payer: Self-pay

## 2013-06-14 MED ORDER — METOPROLOL SUCCINATE ER 25 MG PO TB24: 25.0000 mg | ORAL_TABLET | Freq: Every day | ORAL | Status: AC

## 2013-06-14 NOTE — Telephone Encounter (Signed)
The facility needs an order, this is typically sent by the clinic staff, as it is different from an Rx.  I spoke with the daughter, and she said she already spoke to [facility] about this order.

## 2013-06-14 NOTE — Telephone Encounter (Signed)
Rx faxed again to [facility] at [phone]. Had wrong fax number.

## 2013-06-14 NOTE — Telephone Encounter (Signed)
Called daughter Joyce Gross and she stated that the fax number is [phone]. It was confirmed.

## 2013-06-14 NOTE — Telephone Encounter (Signed)
Rx faxed to [facility] at [phone].

## 2013-07-22 ENCOUNTER — Encounter (HOSPITAL_COMMUNITY): Payer: Self-pay | Admitting: Emergency Medicine

## 2013-07-22 ENCOUNTER — Inpatient Hospital Stay (HOSPITAL_COMMUNITY): Payer: Medicare Other

## 2013-07-22 ENCOUNTER — Inpatient Hospital Stay (HOSPITAL_COMMUNITY): Payer: Medicare Other | Admitting: Anesthesiology

## 2013-07-22 ENCOUNTER — Inpatient Hospital Stay (HOSPITAL_COMMUNITY)
Admission: EM | Admit: 2013-07-22 | Discharge: 2013-07-27 | DRG: 956 | Disposition: A | Discharge status: Skilled Nursing Facility | Payer: Medicare Other | Attending: Internal Medicine | Admitting: Internal Medicine

## 2013-07-22 ENCOUNTER — Encounter (HOSPITAL_COMMUNITY): Payer: Medicare Other | Admitting: Anesthesiology

## 2013-07-22 ENCOUNTER — Emergency Department (HOSPITAL_COMMUNITY): Payer: Medicare Other

## 2013-07-22 ENCOUNTER — Encounter (HOSPITAL_COMMUNITY)
Admission: EM | Disposition: A | Discharge status: Skilled Nursing Facility | Payer: Self-pay | Source: Home / Self Care | Attending: Internal Medicine

## 2013-07-22 DIAGNOSIS — J479 Bronchiectasis, uncomplicated: Secondary | ICD-10-CM

## 2013-07-22 DIAGNOSIS — S22009A Unspecified fracture of unspecified thoracic vertebra, initial encounter for closed fracture: Secondary | ICD-10-CM | POA: Diagnosis present

## 2013-07-22 DIAGNOSIS — I959 Hypotension, unspecified: Secondary | ICD-10-CM

## 2013-07-22 DIAGNOSIS — N183 Chronic kidney disease, stage 3 unspecified: Secondary | ICD-10-CM

## 2013-07-22 DIAGNOSIS — Z87891 Personal history of nicotine dependence: Secondary | ICD-10-CM

## 2013-07-22 DIAGNOSIS — R1310 Dysphagia, unspecified: Secondary | ICD-10-CM | POA: Diagnosis present

## 2013-07-22 DIAGNOSIS — Z66 Do not resuscitate: Secondary | ICD-10-CM | POA: Diagnosis present

## 2013-07-22 DIAGNOSIS — J9600 Acute respiratory failure, unspecified whether with hypoxia or hypercapnia: Secondary | ICD-10-CM | POA: Diagnosis not present

## 2013-07-22 DIAGNOSIS — E46 Unspecified protein-calorie malnutrition: Secondary | ICD-10-CM | POA: Diagnosis present

## 2013-07-22 DIAGNOSIS — Z7982 Long term (current) use of aspirin: Secondary | ICD-10-CM

## 2013-07-22 DIAGNOSIS — Y9210 Unspecified residential institution as the place of occurrence of the external cause: Secondary | ICD-10-CM | POA: Diagnosis present

## 2013-07-22 DIAGNOSIS — Z993 Dependence on wheelchair: Secondary | ICD-10-CM

## 2013-07-22 DIAGNOSIS — W050XXA Fall from non-moving wheelchair, initial encounter: Secondary | ICD-10-CM | POA: Diagnosis present

## 2013-07-22 DIAGNOSIS — Z885 Allergy status to narcotic agent status: Secondary | ICD-10-CM

## 2013-07-22 DIAGNOSIS — F3289 Other specified depressive episodes: Secondary | ICD-10-CM

## 2013-07-22 DIAGNOSIS — R269 Unspecified abnormalities of gait and mobility: Secondary | ICD-10-CM

## 2013-07-22 DIAGNOSIS — Z882 Allergy status to sulfonamides status: Secondary | ICD-10-CM

## 2013-07-22 DIAGNOSIS — E875 Hyperkalemia: Secondary | ICD-10-CM

## 2013-07-22 DIAGNOSIS — Z823 Family history of stroke: Secondary | ICD-10-CM

## 2013-07-22 DIAGNOSIS — Z833 Family history of diabetes mellitus: Secondary | ICD-10-CM

## 2013-07-22 DIAGNOSIS — G252 Other specified forms of tremor: Secondary | ICD-10-CM

## 2013-07-22 DIAGNOSIS — Z8249 Family history of ischemic heart disease and other diseases of the circulatory system: Secondary | ICD-10-CM

## 2013-07-22 DIAGNOSIS — E860 Dehydration: Secondary | ICD-10-CM

## 2013-07-22 DIAGNOSIS — Z85828 Personal history of other malignant neoplasm of skin: Secondary | ICD-10-CM

## 2013-07-22 DIAGNOSIS — R627 Adult failure to thrive: Secondary | ICD-10-CM

## 2013-07-22 DIAGNOSIS — Z681 Body mass index (BMI) 19 or less, adult: Secondary | ICD-10-CM

## 2013-07-22 DIAGNOSIS — S22000A Wedge compression fracture of unspecified thoracic vertebra, initial encounter for closed fracture: Secondary | ICD-10-CM

## 2013-07-22 DIAGNOSIS — D72829 Elevated white blood cell count, unspecified: Secondary | ICD-10-CM

## 2013-07-22 DIAGNOSIS — M810 Age-related osteoporosis without current pathological fracture: Secondary | ICD-10-CM

## 2013-07-22 DIAGNOSIS — Z79899 Other long term (current) drug therapy: Secondary | ICD-10-CM

## 2013-07-22 DIAGNOSIS — S72143A Displaced intertrochanteric fracture of unspecified femur, initial encounter for closed fracture: Principal | ICD-10-CM | POA: Diagnosis present

## 2013-07-22 DIAGNOSIS — F329 Major depressive disorder, single episode, unspecified: Secondary | ICD-10-CM

## 2013-07-22 DIAGNOSIS — Z9181 History of falling: Secondary | ICD-10-CM

## 2013-07-22 DIAGNOSIS — S72142A Displaced intertrochanteric fracture of left femur, initial encounter for closed fracture: Secondary | ICD-10-CM

## 2013-07-22 DIAGNOSIS — D62 Acute posthemorrhagic anemia: Secondary | ICD-10-CM

## 2013-07-22 DIAGNOSIS — I129 Hypertensive chronic kidney disease with stage 1 through stage 4 chronic kidney disease, or unspecified chronic kidney disease: Secondary | ICD-10-CM | POA: Diagnosis present

## 2013-07-22 DIAGNOSIS — M1990 Unspecified osteoarthritis, unspecified site: Secondary | ICD-10-CM | POA: Diagnosis present

## 2013-07-22 DIAGNOSIS — Z9849 Cataract extraction status, unspecified eye: Secondary | ICD-10-CM

## 2013-07-22 DIAGNOSIS — K219 Gastro-esophageal reflux disease without esophagitis: Secondary | ICD-10-CM

## 2013-07-22 DIAGNOSIS — R930 Abnormal findings on diagnostic imaging of skull and head, not elsewhere classified: Secondary | ICD-10-CM

## 2013-07-22 DIAGNOSIS — N170 Acute kidney failure with tubular necrosis: Secondary | ICD-10-CM | POA: Diagnosis not present

## 2013-07-22 DIAGNOSIS — G250 Essential tremor: Secondary | ICD-10-CM | POA: Diagnosis present

## 2013-07-22 DIAGNOSIS — F0390 Unspecified dementia without behavioral disturbance: Secondary | ICD-10-CM | POA: Diagnosis present

## 2013-07-22 DIAGNOSIS — N179 Acute kidney failure, unspecified: Secondary | ICD-10-CM

## 2013-07-22 DIAGNOSIS — S32509A Unspecified fracture of unspecified pubis, initial encounter for closed fracture: Secondary | ICD-10-CM | POA: Diagnosis present

## 2013-07-22 HISTORY — PX: INTRAMEDULLARY (IM) NAIL INTERTROCHANTERIC: SHX5875

## 2013-07-22 HISTORY — DX: Displaced intertrochanteric fracture of left femur, initial encounter for closed fracture: S72.142A

## 2013-07-22 HISTORY — DX: Wedge compression fracture of unspecified thoracic vertebra, initial encounter for closed fracture: S22.000A

## 2013-07-22 LAB — CBC WITH DIFFERENTIAL/PLATELET
BASOS ABS: 0 10*3/uL (ref 0.0–0.1)
Basophils Relative: 0 % (ref 0–1)
EOS ABS: 0.2 10*3/uL (ref 0.0–0.7)
Eosinophils Relative: 1 % (ref 0–5)
HCT: 30.5 % — ABNORMAL LOW (ref 36.0–46.0)
Hemoglobin: 10.4 g/dL — ABNORMAL LOW (ref 12.0–15.0)
LYMPHS ABS: 2 10*3/uL (ref 0.7–4.0)
Lymphocytes Relative: 12 % (ref 12–46)
MCH: 31.8 pg (ref 26.0–34.0)
MCHC: 34.1 g/dL (ref 30.0–36.0)
MCV: 93.3 fL (ref 78.0–100.0)
MONO ABS: 1.1 10*3/uL — AB (ref 0.1–1.0)
Monocytes Relative: 7 % (ref 3–12)
Neutro Abs: 13 10*3/uL — ABNORMAL HIGH (ref 1.7–7.7)
Neutrophils Relative %: 80 % — ABNORMAL HIGH (ref 43–77)
PLATELETS: 302 10*3/uL (ref 150–400)
RBC: 3.27 MIL/uL — ABNORMAL LOW (ref 3.87–5.11)
RDW: 14.9 % (ref 11.5–15.5)
WBC MORPHOLOGY: INCREASED
WBC: 16.3 10*3/uL — ABNORMAL HIGH (ref 4.0–10.5)

## 2013-07-22 LAB — BASIC METABOLIC PANEL
BUN: 32 mg/dL — ABNORMAL HIGH (ref 6–23)
CALCIUM: 8.7 mg/dL (ref 8.4–10.5)
CO2: 27 mEq/L (ref 19–32)
CREATININE: 0.96 mg/dL (ref 0.50–1.10)
Chloride: 99 mEq/L (ref 96–112)
GFR calc Af Amer: 59 mL/min — ABNORMAL LOW (ref >90)
GFR calc non Af Amer: 51 mL/min — ABNORMAL LOW (ref >90)
GLUCOSE: 122 mg/dL — AB (ref 70–99)
Potassium: 5.2 mEq/L (ref 3.7–5.3)
SODIUM: 138 meq/L (ref 137–147)

## 2013-07-22 LAB — SURGICAL PCR SCREEN
MRSA, PCR: NEGATIVE
Staphylococcus aureus: NEGATIVE

## 2013-07-22 LAB — URINALYSIS, ROUTINE W REFLEX MICROSCOPIC
Bilirubin Urine: NEGATIVE
GLUCOSE, UA: NEGATIVE mg/dL
Ketones, ur: NEGATIVE mg/dL
Leukocytes, UA: NEGATIVE
Nitrite: NEGATIVE
PROTEIN: 100 mg/dL — AB
SPECIFIC GRAVITY, URINE: 1.024 (ref 1.005–1.030)
Urobilinogen, UA: 1 mg/dL (ref 0.0–1.0)
pH: 6 (ref 5.0–8.0)

## 2013-07-22 LAB — URINE MICROSCOPIC-ADD ON

## 2013-07-22 LAB — ABO/RH: ABO/RH(D): A POS

## 2013-07-22 LAB — PROTIME-INR
INR: 1.16 (ref 0.00–1.49)
PROTHROMBIN TIME: 14.6 s (ref 11.6–15.2)

## 2013-07-22 SURGERY — FIXATION, FRACTURE, INTERTROCHANTERIC, WITH INTRAMEDULLARY ROD
Anesthesia: General | Site: Hip | Laterality: Left

## 2013-07-22 MED ORDER — ALBUMIN HUMAN 5 % IV SOLN: INTRAVENOUS | Status: DC | PRN

## 2013-07-22 MED ORDER — PHENYLEPHRINE 40 MCG/ML (10ML) SYRINGE FOR IV PUSH (FOR BLOOD PRESSURE SUPPORT)
PREFILLED_SYRINGE | INTRAVENOUS | Status: AC
  Filled 2013-07-22: qty 10

## 2013-07-22 MED ORDER — MIDODRINE HCL 2.5 MG PO TABS
2.5000 mg | ORAL_TABLET | Freq: Three times a day (TID) | ORAL | Status: DC
  Administered 2013-07-23 – 2013-07-25 (×7): 2.5 mg via ORAL
  Filled 2013-07-22 (×11): qty 1

## 2013-07-22 MED ORDER — ROCURONIUM BROMIDE 50 MG/5ML IV SOLN
INTRAVENOUS | Status: AC
  Filled 2013-07-22: qty 1

## 2013-07-22 MED ORDER — ARTIFICIAL TEARS OP OINT
TOPICAL_OINTMENT | OPHTHALMIC | Status: AC
Start: 2013-07-22 — End: 2013-07-22
  Filled 2013-07-22: qty 3.5

## 2013-07-22 MED ORDER — 0.9 % SODIUM CHLORIDE (POUR BTL) OPTIME
TOPICAL | Status: DC | PRN
  Administered 2013-07-22: 1000 mL

## 2013-07-22 MED ORDER — STERILE WATER FOR INJECTION IJ SOLN
INTRAMUSCULAR | Status: AC
  Filled 2013-07-22: qty 10

## 2013-07-22 MED ORDER — METOPROLOL SUCCINATE ER 25 MG PO TB24
25.0000 mg | ORAL_TABLET | Freq: Every day | ORAL | Status: DC
  Filled 2013-07-22 (×2): qty 1

## 2013-07-22 MED ORDER — ALBUTEROL SULFATE (2.5 MG/3ML) 0.083% IN NEBU: 2.5000 mg | INHALATION_SOLUTION | RESPIRATORY_TRACT | Status: DC | PRN

## 2013-07-22 MED ORDER — LACTATED RINGERS IV SOLN
INTRAVENOUS | Status: DC | PRN
  Administered 2013-07-22: 21:00:00 via INTRAVENOUS

## 2013-07-22 MED ORDER — FENTANYL CITRATE 0.05 MG/ML IJ SOLN
25.0000 ug | Freq: Once | INTRAMUSCULAR | Status: AC
  Administered 2013-07-22: 25 ug via INTRAVENOUS
  Filled 2013-07-22: qty 2

## 2013-07-22 MED ORDER — PROPOFOL 10 MG/ML IV BOLUS
INTRAVENOUS | Status: DC | PRN
  Administered 2013-07-22: 70 mg via INTRAVENOUS

## 2013-07-22 MED ORDER — ONDANSETRON HCL 4 MG/2ML IJ SOLN
INTRAMUSCULAR | Status: DC | PRN
  Administered 2013-07-22: 4 mg via INTRAVENOUS

## 2013-07-22 MED ORDER — DEXAMETHASONE SODIUM PHOSPHATE 4 MG/ML IJ SOLN
INTRAMUSCULAR | Status: AC
  Filled 2013-07-22: qty 1

## 2013-07-22 MED ORDER — SENNOSIDES-DOCUSATE SODIUM 8.6-50 MG PO TABS
1.0000 | ORAL_TABLET | Freq: Every evening | ORAL | Status: DC | PRN
  Administered 2013-07-24: 1 via ORAL
  Filled 2013-07-22: qty 1

## 2013-07-22 MED ORDER — PROPOFOL 10 MG/ML IV BOLUS
INTRAVENOUS | Status: AC
  Filled 2013-07-22: qty 20

## 2013-07-22 MED ORDER — DEXTROSE 5 % IV SOLN
INTRAVENOUS | Status: DC | PRN
  Administered 2013-07-22: 22:00:00 via INTRAVENOUS

## 2013-07-22 MED ORDER — GLYCOPYRROLATE 0.2 MG/ML IJ SOLN
INTRAMUSCULAR | Status: DC | PRN
  Administered 2013-07-22: .5 mg via INTRAVENOUS

## 2013-07-22 MED ORDER — FENTANYL CITRATE 0.05 MG/ML IJ SOLN
25.0000 ug | Freq: Once | INTRAMUSCULAR | Status: AC
  Administered 2013-07-22: 25 ug via INTRAVENOUS

## 2013-07-22 MED ORDER — NEOSTIGMINE METHYLSULFATE 1 MG/ML IJ SOLN
INTRAMUSCULAR | Status: DC | PRN
  Administered 2013-07-22: 4 mg via INTRAVENOUS

## 2013-07-22 MED ORDER — NEOSTIGMINE METHYLSULFATE 1 MG/ML IJ SOLN
INTRAMUSCULAR | Status: AC
  Filled 2013-07-22: qty 10

## 2013-07-22 MED ORDER — ENOXAPARIN SODIUM 30 MG/0.3ML ~~LOC~~ SOLN: 30.0000 mg | SUBCUTANEOUS | Status: AC

## 2013-07-22 MED ORDER — CEFAZOLIN SODIUM 1-5 GM-% IV SOLN
1.0000 g | INTRAVENOUS | Status: AC
  Administered 2013-07-22: 1 g via INTRAVENOUS
  Filled 2013-07-22: qty 50

## 2013-07-22 MED ORDER — SERTRALINE HCL 50 MG PO TABS
50.0000 mg | ORAL_TABLET | Freq: Every day | ORAL | Status: DC
  Administered 2013-07-23 – 2013-07-27 (×5): 50 mg via ORAL
  Filled 2013-07-22 (×5): qty 1

## 2013-07-22 MED ORDER — FENTANYL CITRATE 0.05 MG/ML IJ SOLN
25.0000 ug | INTRAMUSCULAR | Status: DC | PRN
  Administered 2013-07-22 – 2013-07-23 (×4): 25 ug via INTRAVENOUS

## 2013-07-22 MED ORDER — FENTANYL CITRATE 0.05 MG/ML IJ SOLN: 50.0000 ug | INTRAMUSCULAR | Status: DC | PRN

## 2013-07-22 MED ORDER — ONDANSETRON HCL 4 MG/2ML IJ SOLN
4.0000 mg | Freq: Once | INTRAMUSCULAR | Status: AC
  Administered 2013-07-22: 4 mg via INTRAVENOUS
  Filled 2013-07-22: qty 2

## 2013-07-22 MED ORDER — DEXAMETHASONE SODIUM PHOSPHATE 10 MG/ML IJ SOLN
INTRAMUSCULAR | Status: DC | PRN
  Administered 2013-07-22: 4 mg via INTRAVENOUS

## 2013-07-22 MED ORDER — ACETAMINOPHEN 325 MG PO TABS: 650.0000 mg | ORAL_TABLET | Freq: Four times a day (QID) | ORAL | Status: AC | PRN

## 2013-07-22 MED ORDER — ENOXAPARIN SODIUM 40 MG/0.4ML ~~LOC~~ SOLN
40.0000 mg | SUBCUTANEOUS | Status: DC
  Filled 2013-07-22: qty 0.4

## 2013-07-22 MED ORDER — FENTANYL CITRATE 0.05 MG/ML IJ SOLN
INTRAMUSCULAR | Status: DC | PRN
  Administered 2013-07-22 (×3): 50 ug via INTRAVENOUS

## 2013-07-22 MED ORDER — GLYCOPYRROLATE 0.2 MG/ML IJ SOLN
INTRAMUSCULAR | Status: AC
  Filled 2013-07-22: qty 3

## 2013-07-22 MED ORDER — POTASSIUM CHLORIDE IN NACL 20-0.45 MEQ/L-% IV SOLN
INTRAVENOUS | Status: DC
  Administered 2013-07-23: 01:00:00 via INTRAVENOUS
  Filled 2013-07-22: qty 1000

## 2013-07-22 MED ORDER — LIDOCAINE HCL (CARDIAC) 20 MG/ML IV SOLN
INTRAVENOUS | Status: DC | PRN
  Administered 2013-07-22: 40 mg via INTRAVENOUS
  Administered 2013-07-22: 60 mg via INTRAVENOUS

## 2013-07-22 MED ORDER — MIRTAZAPINE 15 MG PO TABS
15.0000 mg | ORAL_TABLET | Freq: Every day | ORAL | Status: DC
  Administered 2013-07-23 – 2013-07-26 (×4): 15 mg via ORAL
  Filled 2013-07-22 (×6): qty 1

## 2013-07-22 MED ORDER — LIDOCAINE HCL (CARDIAC) 20 MG/ML IV SOLN
INTRAVENOUS | Status: AC
  Filled 2013-07-22: qty 5

## 2013-07-22 MED ORDER — ALPRAZOLAM 0.25 MG PO TABS: 0.1250 mg | ORAL_TABLET | Freq: Three times a day (TID) | ORAL | Status: DC | PRN

## 2013-07-22 MED ORDER — ALBUMIN HUMAN 5 % IV SOLN
INTRAVENOUS | Status: DC | PRN
  Administered 2013-07-22: 22:00:00 via INTRAVENOUS

## 2013-07-22 MED ORDER — SODIUM CHLORIDE 0.9 % IV SOLN
10.0000 mg | INTRAVENOUS | Status: DC | PRN
  Administered 2013-07-22: 15 ug/min via INTRAVENOUS

## 2013-07-22 MED ORDER — FENTANYL CITRATE 0.05 MG/ML IJ SOLN
12.5000 ug | INTRAMUSCULAR | Status: DC | PRN
  Administered 2013-07-23 (×2): 12.5 ug via INTRAVENOUS
  Filled 2013-07-22 (×2): qty 2

## 2013-07-22 MED ORDER — ROCURONIUM BROMIDE 100 MG/10ML IV SOLN
INTRAVENOUS | Status: DC | PRN
  Administered 2013-07-22: 25 mg via INTRAVENOUS

## 2013-07-22 MED ORDER — CEFAZOLIN SODIUM 1-5 GM-% IV SOLN
INTRAVENOUS | Status: AC
  Filled 2013-07-22: qty 50

## 2013-07-22 MED ORDER — VECURONIUM BROMIDE 10 MG IV SOLR
INTRAVENOUS | Status: AC
  Filled 2013-07-22: qty 10

## 2013-07-22 MED ORDER — ARTIFICIAL TEARS OP OINT
TOPICAL_OINTMENT | OPHTHALMIC | Status: DC | PRN
  Administered 2013-07-22: 1 via OPHTHALMIC

## 2013-07-22 MED ORDER — FENTANYL CITRATE 0.05 MG/ML IJ SOLN
INTRAMUSCULAR | Status: AC
  Filled 2013-07-22: qty 5

## 2013-07-22 MED ORDER — SUCCINYLCHOLINE CHLORIDE 20 MG/ML IJ SOLN
INTRAMUSCULAR | Status: AC
  Filled 2013-07-22: qty 1

## 2013-07-22 MED ORDER — FENTANYL CITRATE 0.05 MG/ML IJ SOLN
INTRAMUSCULAR | Status: AC
  Filled 2013-07-22: qty 2

## 2013-07-22 SURGICAL SUPPLY — 51 items
11.0 TITANIUM HELICAL BLADE ×3 IMPLANT
APL SKNCLS STERI-STRIP NONHPOA (GAUZE/BANDAGES/DRESSINGS) ×6
BENZOIN TINCTURE PRP APPL 2/3 (GAUZE/BANDAGES/DRESSINGS) ×10 IMPLANT
BIT DRILL 4.0X195MM (BIT) ×1 IMPLANT
BIT DRILL CANN LRG QC 17.0X300 (BIT) ×3 IMPLANT
BLADE TI HELICAL 11.0 95 (Orthopedic Implant) ×2 IMPLANT
BLADE TI HELICAL 11.0MM 95MM (Orthopedic Implant) ×1 IMPLANT
BOOTCOVER CLEANROOM LRG (PROTECTIVE WEAR) ×8 IMPLANT
CLOSURE STERI-STRIP 1/2X4 (GAUZE/BANDAGES/DRESSINGS) ×3
CLOTH BEACON ORANGE TIMEOUT ST (SAFETY) ×4 IMPLANT
CLSR STERI-STRIP ANTIMIC 1/2X4 (GAUZE/BANDAGES/DRESSINGS) ×7 IMPLANT
COVER MAYO STAND STRL (DRAPES) ×3 IMPLANT
COVER SURGICAL LIGHT HANDLE (MISCELLANEOUS) ×4 IMPLANT
DRAPE STERI IOBAN 125X83 (DRAPES) ×4 IMPLANT
DRILL BIT 4.0X195MM (BIT) ×2
DRILL BIT OPEN (BIT) ×3 IMPLANT
DRSG MEPILEX BORDER 4X4 (GAUZE/BANDAGES/DRESSINGS) ×10 IMPLANT
DRSG MEPILEX BORDER 4X8 (GAUZE/BANDAGES/DRESSINGS) ×8 IMPLANT
DURAPREP 26ML APPLICATOR (WOUND CARE) ×4 IMPLANT
ELECT CAUTERY BLADE 6.4 (BLADE) ×4 IMPLANT
ELECT REM PT RETURN 9FT ADLT (ELECTROSURGICAL) ×4
ELECTRODE REM PT RTRN 9FT ADLT (ELECTROSURGICAL) ×2 IMPLANT
EVACUATOR 1/8 PVC DRAIN (DRAIN) IMPLANT
FACESHIELD LNG OPTICON STERILE (SAFETY) ×2 IMPLANT
GAUZE XEROFORM 5X9 LF (GAUZE/BANDAGES/DRESSINGS) ×4 IMPLANT
GLOVE BIOGEL PI IND STRL 6.5 (GLOVE) ×1 IMPLANT
GLOVE BIOGEL PI INDICATOR 6.5 (GLOVE) ×2
GLOVE BIOGEL PI ORTHO PRO SZ8 (GLOVE) ×2
GLOVE ORTHO TXT STRL SZ7.5 (GLOVE) ×5 IMPLANT
GLOVE PI ORTHO PRO STRL SZ8 (GLOVE) ×2 IMPLANT
GLOVE SURG ORTHO 8.0 STRL STRW (GLOVE) ×5 IMPLANT
GOWN STRL NON-REIN LRG LVL3 (GOWN DISPOSABLE) ×6 IMPLANT
GUIDEWIRE 3.2X400 (WIRE) ×3 IMPLANT
KIT ROOM TURNOVER OR (KITS) ×4 IMPLANT
MANIFOLD NEPTUNE II (INSTRUMENTS) ×4 IMPLANT
NAIL TROCH 11X130 380MM (Nail) ×3 IMPLANT
NS IRRIG 1000ML POUR BTL (IV SOLUTION) ×4 IMPLANT
PACK GENERAL/GYN (CUSTOM PROCEDURE TRAY) ×4 IMPLANT
PAD ARMBOARD 7.5X6 YLW CONV (MISCELLANEOUS) ×8 IMPLANT
SCREW LOCKING IM 5.0MX40M (Screw) ×3 IMPLANT
SCREW LOCKING IM 5.0MX44M (Screw) ×3 IMPLANT
STAPLER VISISTAT 35W (STAPLE) ×4 IMPLANT
SUT VIC AB 0 CTB1 27 (SUTURE) ×4 IMPLANT
SUT VIC AB 2-0 FS1 27 (SUTURE) ×4 IMPLANT
SUT VIC AB 2-0 SH 27 (SUTURE)
SUT VIC AB 2-0 SH 27XBRD (SUTURE) IMPLANT
SUT VIC AB 3-0 SH 8-18 (SUTURE) ×4 IMPLANT
SYR BULB IRRIGATION 50ML (SYRINGE) ×3 IMPLANT
TOWEL OR 17X24 6PK STRL BLUE (TOWEL DISPOSABLE) ×4 IMPLANT
TOWEL OR 17X26 10 PK STRL BLUE (TOWEL DISPOSABLE) ×4 IMPLANT
WATER STERILE IRR 1000ML POUR (IV SOLUTION) ×4 IMPLANT

## 2013-07-22 NOTE — ED Notes (Signed)
Inserted 40F foley catheter.

## 2013-07-22 NOTE — Anesthesia Preprocedure Evaluation (Addendum)
Anesthesia Evaluation  Patient identified by MRN, date of birth, ID band Patient awake    Reviewed: Allergy & Precautions, H&P , NPO status , Patient's Chart, lab work & pertinent test results  Airway       Dental   Pulmonary shortness of breath, COPDformer smoker,  New lung opacity/nodule brochiectasis SaO2 in high 80s/low 90s Chronic cough         Cardiovascular  Cardiomegaly Suggestive new inf lead changes   Neuro/Psych Anxiety Depression Tremor Memory changes    GI/Hepatic GERD-  ,  Endo/Other    Renal/GU      Musculoskeletal   Abdominal   Peds  Hematology  (+) anemia ,   Anesthesia Other Findings Spine compression fxs Elevated WBC count  Reproductive/Obstetrics                         Anesthesia Physical Anesthesia Plan  ASA: IV and emergent  Anesthesia Plan: General   Post-op Pain Management:    Induction: Intravenous  Airway Management Planned: Oral ETT  Additional Equipment:   Intra-op Plan:   Post-operative Plan: Extubation in OR  Informed Consent: I have reviewed the patients History and Physical, chart, labs and discussed the procedure including the risks, benefits and alternatives for the proposed anesthesia with the patient or authorized representative who has indicated his/her understanding and acceptance.     Plan Discussed with: CRNA and Surgeon  Anesthesia Plan Comments:         Anesthesia Quick Evaluation

## 2013-07-22 NOTE — Anesthesia Procedure Notes (Addendum)
Procedure Name: Intubation Date/Time: 07/22/2013 9:25 PM Performed by: Jacquiline Doe A Pre-anesthesia Checklist: Patient identified, Timeout performed, Emergency Drugs available, Suction available and Patient being monitored Patient Re-evaluated:Patient Re-evaluated prior to inductionOxygen Delivery Method: Circle system utilized Intubation Type: IV induction and Cricoid Pressure applied Ventilation: Mask ventilation without difficulty Laryngoscope Size: Mac and 3 Grade View: Grade I Tube type: Oral Tube size: 7.0 mm Number of attempts: 1 Airway Equipment and Method: Stylet Placement Confirmation: ETT inserted through vocal cords under direct vision,  breath sounds checked- equal and bilateral and positive ETCO2 Secured at: 21 cm Tube secured with: Tape Dental Injury: Teeth and Oropharynx as per pre-operative assessment

## 2013-07-22 NOTE — ED Notes (Addendum)
To room via EMS.  Witness fall at [facility].   Pt was in seated postion in wheelchair, tried to stand and fell.  Left leg turned out.  + pedal/post tib pulse.  Pt alert to name, place.  Pt vomited upon admission to room.

## 2013-07-22 NOTE — Transfer of Care (Signed)
Immediate Anesthesia Transfer of Care Note  Patient: Nancy Ponce  Procedure(s) Performed: Procedure(s): INTRAMEDULLARY (IM) NAIL INTERTROCHANTRIC (Left)  Patient Location: PACU  Anesthesia Type:General  Level of Consciousness: awake, sedated, patient cooperative and responds to stimulation  Airway & Oxygen Therapy: Patient Spontanous Breathing and Patient connected to nasal cannula oxygen  Post-op Assessment: Report given to PACU RN, Post -op Vital signs reviewed and stable, Patient moving all extremities and Patient moving all extremities X 4  Post vital signs: Reviewed and stable  Complications: No apparent anesthesia complications

## 2013-07-22 NOTE — ED Provider Notes (Signed)
CSN: [id number]     Arrival date & time 07/22/13  1511 History   First MD Initiated Contact with Patient 07/22/13 1511     Chief Complaint  Patient presents with  . Hip Pain  . Fall   (Consider location/radiation/quality/duration/timing/severity/associated sxs/prior Treatment) HPI Comments: Patient is a 80 year old female with history of osteoporosis, dementia, bronchiectasis who presents today after a fall. She has been living at [facility] for the past 3 months. Today she was getting up out of her wheelchair to lunge at another patient when she fell. The paramedic reports that he does not believe she was standing up fully upright when she fell. She did not hit her head and there was no LOC. She is not on any blood thinners. She has a deformity of her left leg. Her daughter reports her tetanus is UTD.   The history is provided by the patient. No language interpreter was used.    Past Medical History  Diagnosis Date  . Bronchiectasis without acute exacerbation   . Osteoporosis, unspecified   . Depressive disorder, not elsewhere classified   . Essential and other specified forms of tremor 04/17/2013  . Abnormality of gait 04/17/2013  . Degenerative arthritis   . Cancer     Basal cell carcinoma, right face   Past Surgical History  Procedure Laterality Date  . Breast biopsy    . Rotator cuff    . Total abdominal hysterectomy w/ bilateral salpingoophorectomy    . Cholecystectomy    . Appendectomy    . Mohs surgery    . Tonsillectomy    . Cataract extraction Bilateral   . Rotator cuff tear repair Right    Family History  Problem Relation Age of Onset  . Emphysema Father   . Heart disease Mother   . Stroke Mother   . Cancer Sister   . Diabetes Sister    History  Substance Use Topics  . Smoking status: Former Smoker    Quit date: 02/05/1953  . Smokeless tobacco: Not on file  . Alcohol Use: No   OB History   Grav Para Term Preterm Abortions TAB SAB Ect Mult  Living                 Review of Systems  Unable to perform ROS: Dementia    Allergies  Codeine  Home Medications   Current Outpatient Rx  Name  Route  Sig  Dispense  Refill  . ALPRAZolam (XANAX) 0.25 MG tablet      Take 1/2 by mouth three times a day as needed   45 tablet   5     Pharmacy Fax [phone]   . aspirin 81 MG tablet   Oral   Take 81 mg by mouth daily.           . megestrol (MEGACE) 400 MG/10ML suspension   Oral   Take 200 mg by mouth daily.         . metoprolol succinate (TOPROL XL) 25 MG 24 hr tablet   Oral   Take 1 tablet (25 mg total) by mouth at bedtime.   30 tablet   3     Fax order to [facility] home at [phone]   . midodrine (PROAMATINE) 2.5 MG tablet   Oral   Take 2.5 mg by mouth 3 (three) times daily.           . mirtazapine (REMERON) 15 MG tablet   Oral   Take 7.5 mg  by mouth at bedtime.            BP 127/63  Pulse 93  Temp(Src) 97.7 F (36.5 C) (Oral)  Resp 33  SpO2 91% Physical Exam  Nursing note and vitals reviewed. Constitutional: She is oriented to person, place, and time. She appears well-developed and well-nourished. No distress.  frail  HENT:  Head: Normocephalic.    Right Ear: External ear normal.  Left Ear: External ear normal.  Nose: Nose normal.  Mouth/Throat: Oropharynx is clear and moist. Mucous membranes are dry.  2 cm superficial abrasion to left forehead  Eyes: Conjunctivae and EOM are normal. Pupils are equal, round, and reactive to light.  Neck: Normal range of motion. No spinous process tenderness and no muscular tenderness present.  Cardiovascular: Normal rate, regular rhythm, normal heart sounds, intact distal pulses and normal pulses.   Pulses:      Radial pulses are 2+ on the right side, and 2+ on the left side.       Dorsalis pedis pulses are 2+ on the right side, and 2+ on the left side.       Posterior tibial pulses are 2+ on the right side, and 2+ on the left side.  Pulmonary/Chest: Effort  normal and breath sounds normal. No stridor. No respiratory distress. She has no wheezes. She has no rales.  Abdominal: Soft. She exhibits no distension.  Musculoskeletal: Normal range of motion.  Left hip is externally rotated and shortened   Neurological: She is alert and oriented to person, place, and time. She has normal strength.  tremulous  Skin: Skin is warm and dry. She is not diaphoretic. No erythema.  Psychiatric: She has a normal mood and affect. Her behavior is normal.    ED Course  Procedures (including critical care time) Labs Review Labs Reviewed  BASIC METABOLIC PANEL - Abnormal; Notable for the following:    Glucose, Bld 122 (*)    BUN 32 (*)    GFR calc non Af Amer 51 (*)    GFR calc Af Amer 59 (*)    All other components within normal limits  CBC WITH DIFFERENTIAL - Abnormal; Notable for the following:    WBC 16.3 (*)    RBC 3.27 (*)    Hemoglobin 10.4 (*)    HCT 30.5 (*)    Neutrophils Relative % 80 (*)    Neutro Abs 13.0 (*)    Monocytes Absolute 1.1 (*)    All other components within normal limits  PROTIME-INR  URINALYSIS, ROUTINE W REFLEX MICROSCOPIC  TYPE AND SCREEN   Imaging Review Dg Chest 1 View  07/22/2013   CLINICAL DATA:  Fall, left hip fracture  EXAM: CHEST - 1 VIEW  COMPARISON:  03/15/2013  FINDINGS: Stable mild cardiomegaly with central vascular congestion. Hyperinflation noted compatible with background COPD/ emphysema. Patchy and nodular airspace opacities bilaterally in the right mid lung, both hilar regions, and the lower lobes. Some of these opacities correlate with areas of known bronchiectasis in the right middle lobe, lingula and left lower lobe with chronic mucous plugging. No superimposed edema. No effusion or pneumothorax. Right upper lobe midlung nodule noted measures 15 mm appearing new since the prior study.  IMPRESSION: Cardiomegaly with vascular congestion  Chronic right middle lobe, lingula, and basilar opacities correlating with  known bronchiectasis and probable increased mucus plugging  New right upper lobe 15 mm nodular opacity versus nodule. Consider nonemergent follow-up chest CT for further evaluation  No edema, effusion or pneumothorax  Electronically Signed   By: Daryll Brod M.D.   On: 07/22/2013 17:12   Dg Hip Complete Left  07/22/2013   CLINICAL DATA:  Fall, left hip pain  EXAM: LEFT HIP - COMPLETE 2+ VIEW  COMPARISON:  None.  FINDINGS: There is an acute displaced left hip intertrochanteric fracture with angulation. No associated subluxation or dislocation of the femoral head. Bones appear osteopenic. Right hip intact. Nondisplaced acute fractures also noted of the left superior and inferior rami.  IMPRESSION: Acute displaced and angulated left hip intertrochanteric fracture.  Left superior and inferior rami fractures  Osteopenia   Electronically Signed   By: Daryll Brod M.D.   On: 07/22/2013 17:07   Ct Head Wo Contrast  07/22/2013   CLINICAL DATA:  Fall.  Vomiting after fall.  EXAM: CT HEAD WITHOUT CONTRAST  CT CERVICAL SPINE WITHOUT CONTRAST  TECHNIQUE: Multidetector CT imaging of the head and cervical spine was performed following the standard protocol without intravenous contrast. Multiplanar CT image reconstructions of the cervical spine were also generated.  COMPARISON:  10/13/2005 cervical spine MR. 02/02/2005 brain MR.  FINDINGS: CT HEAD FINDINGS  Fluid level within the sphenoid sinus however, no adjacent skullbase fracture detected. Partial opacification posterior ethmoid sinus air cells.  No intracranial hemorrhage.  Global atrophy. Ventricular prominence felt to be related to atrophy rather than hydrocephalus.  Small vessel disease type changes without CT evidence of large acute infarct.  No intracranial mass lesion noted on this unenhanced exam.  Right parotid 1.4 cm lesion without significant change since 2006. Etiology indeterminate.  CT CERVICAL SPINE FINDINGS  No cervical spine fracture. Cervical  spondylotic changes. Transverse ligament hypertrophy. No abnormal prevertebral soft tissue swelling.  New from the 2007 MR is a T1 superior endplate mild compression fracture with 15% loss of height anteriorly. Age of this is indeterminate.  IMPRESSION: Head CT:  No skull fracture or intracranial hemorrhage.  Global atrophy.  Small vessel disease type changes.  Sphenoid sinus air-fluid level may indicate acute sinusitis. Partial opacification posterior ethmoid sinus air cells.  1.4 cm right parotid lesion unchanged since 2006. Etiology indeterminate.  Cervical spine CT:  No cervical spine fracture.  New from the 2007 MR is a T1 superior endplate mild compression fracture with 15% loss of height anteriorly. Age of this is indeterminate. Acute injury cannot be excluded. If further delineation is clinically desired, cervical spine MR including the upper thoracic spine can be obtained.  These results were called by telephone at the time of interpretation on 07/22/2013 at 4:52 PM to [facility] , who verbally acknowledged these results.   Electronically Signed   By: Chauncey Cruel M.D.   On: 07/22/2013 16:54   Ct Cervical Spine Wo Contrast  07/22/2013   CLINICAL DATA:  Fall.  Vomiting after fall.  EXAM: CT HEAD WITHOUT CONTRAST  CT CERVICAL SPINE WITHOUT CONTRAST  TECHNIQUE: Multidetector CT imaging of the head and cervical spine was performed following the standard protocol without intravenous contrast. Multiplanar CT image reconstructions of the cervical spine were also generated.  COMPARISON:  10/13/2005 cervical spine MR. 02/02/2005 brain MR.  FINDINGS: CT HEAD FINDINGS  Fluid level within the sphenoid sinus however, no adjacent skullbase fracture detected. Partial opacification posterior ethmoid sinus air cells.  No intracranial hemorrhage.  Global atrophy. Ventricular prominence felt to be related to atrophy rather than hydrocephalus.  Small vessel disease type changes without CT evidence of large acute infarct.   No intracranial mass lesion noted on this unenhanced exam.  Right parotid 1.4 cm lesion without significant change since 2006. Etiology indeterminate.  CT CERVICAL SPINE FINDINGS  No cervical spine fracture. Cervical spondylotic changes. Transverse ligament hypertrophy. No abnormal prevertebral soft tissue swelling.  New from the 2007 MR is a T1 superior endplate mild compression fracture with 15% loss of height anteriorly. Age of this is indeterminate.  IMPRESSION: Head CT:  No skull fracture or intracranial hemorrhage.  Global atrophy.  Small vessel disease type changes.  Sphenoid sinus air-fluid level may indicate acute sinusitis. Partial opacification posterior ethmoid sinus air cells.  1.4 cm right parotid lesion unchanged since 2006. Etiology indeterminate.  Cervical spine CT:  No cervical spine fracture.  New from the 2007 MR is a T1 superior endplate mild compression fracture with 15% loss of height anteriorly. Age of this is indeterminate. Acute injury cannot be excluded. If further delineation is clinically desired, cervical spine MR including the upper thoracic spine can be obtained.  These results were called by telephone at the time of interpretation on 07/22/2013 at 4:52 PM to [facility] , who verbally acknowledged these results.   Electronically Signed   By: Chauncey Cruel M.D.   On: 07/22/2013 16:54    EKG Interpretation   None      5:38 PM Discussed this case with Elnita Maxwell, Dr. Archie Endo PA. Dr. Basilio Cairo will start call in 20 min and she will pass this case along to him. Likely surgery tomorrow afternoon.   5:57 PM Discussed case with Dr. Arnoldo Morale who recommends no intervention for T1 compression fracture.   MDM   1. Bronchiectasis without acute exacerbation   2. Essential and other specified forms of tremor   3. Leukocytosis    Patient presents to ED after a fall. Pt with acute displaced and angulated left hip intertrochanteric fracture as well as left superior and inferior rami  fractures. Pt found to have a T1 compression fx that appears stable. Unsure of age of this fx. I consulted Dr. Arnoldo Morale of neurosurgery who recommends no tx at this time. Dr. Sherolyn Buba will perform surgery on patient likely tomorrow afternoon. 2+ DP and PT pulses. Neurovascularly intact. Compartment soft. Patient is hemodynamically stable at this time. Dr. Mingo Amber evaluated patient and agrees with plan. Patient / Family / Caregiver informed of clinical course, understand medical decision-making process, and agree with plan.     Elwyn Lade, PA-C 07/22/13 1859

## 2013-07-22 NOTE — Consult Note (Addendum)
ORTHOPAEDIC CONSULTATION  REQUESTING PHYSICIAN: Osvaldo Shipper, MD  Chief Complaint: fall  HPI: Nancy Ponce is a 80 y.o. female who complains of left hip pain after a fall while transferring from her wheelchair. She has had progressive decline in her ambulatory function over the course of the past couple of months. She now is almost to the point where she cannot ambulate, even though she was capable as of Thanksgiving. She complains acute severe pain over the left hip, and she has fairly advanced dementia, and is not really able to give any history. The history is obtained from speaking with the daughter who is at the bedside who is a Marine scientist. She apparently has also been complaining of back pain, and has had progressive decreased nutritional intake, do to a tremor, and the fact that she declines assistance with oral intake, and the daughter says that this is the only thing that she still maintains independence over.  Past Medical History  Diagnosis Date  . Bronchiectasis without acute exacerbation   . Osteoporosis, unspecified   . Depressive disorder, not elsewhere classified   . Essential and other specified forms of tremor 04/17/2013  . Abnormality of gait 04/17/2013  . Degenerative arthritis   . Cancer     Basal cell carcinoma, right face   Past Surgical History  Procedure Laterality Date  . Breast biopsy    . Rotator cuff    . Total abdominal hysterectomy w/ bilateral salpingoophorectomy    . Cholecystectomy    . Appendectomy    . Mohs surgery    . Tonsillectomy    . Cataract extraction Bilateral   . Rotator cuff tear repair Right    History   Social History  . Marital Status: Widowed    Spouse Name: N/A    Number of Children: N/A  . Years of Education: N/A   Social History Main Topics  . Smoking status: Former Smoker    Quit date: 02/05/1953  . Smokeless tobacco: None  . Alcohol Use: No  . Drug Use: No  . Sexual Activity: None   Other Topics Concern  .  None   Social History Narrative  . None   Family History  Problem Relation Age of Onset  . Emphysema Father   . Heart disease Mother   . Stroke Mother   . Cancer Sister   . Diabetes Sister    Allergies  Allergen Reactions  . Codeine Other (See Comments)    Per [facility]  . Sulfa Antibiotics Other (See Comments)    Per [facility]   . Urecholine [Bethanechol] Other (See Comments)    Per [facility]    Prior to Admission medications   Medication Sig Start Date End Date Taking? Authorizing Provider  ALPRAZolam (XANAX) 0.25 MG tablet Take 0.125 mg by mouth 3 (three) times daily.   Yes Historical Provider, MD  aspirin 81 MG tablet Take 81 mg by mouth daily.    Yes Historical Provider, MD  guaiFENesin (ROBITUSSIN) 100 MG/5ML liquid Take 300 mg by mouth every 4 (four) hours as needed for cough.   Yes Historical Provider, MD  megestrol (MEGACE) 400 MG/10ML suspension Take 200 mg by mouth daily.   Yes Historical Provider, MD  metoprolol succinate (TOPROL XL) 25 MG 24 hr tablet Take 1 tablet (25 mg total) by mouth at bedtime. 06/14/13  Yes Kathrynn Ducking, MD  midodrine (PROAMATINE) 2.5 MG tablet Take 2.5 mg by mouth 3 (three) times daily.     Yes Historical  Provider, MD  mirtazapine (REMERON) 15 MG tablet Take 15 mg by mouth at bedtime.    Yes Historical Provider, MD  sertraline (ZOLOFT) 50 MG tablet Take 50 mg by mouth daily.   Yes Historical Provider, MD   Dg Chest 1 View  07/22/2013   CLINICAL DATA:  Fall, left hip fracture  EXAM: CHEST - 1 VIEW  COMPARISON:  03/15/2013  FINDINGS: Stable mild cardiomegaly with central vascular congestion. Hyperinflation noted compatible with background COPD/ emphysema. Patchy and nodular airspace opacities bilaterally in the right mid lung, both hilar regions, and the lower lobes. Some of these opacities correlate with areas of known bronchiectasis in the right middle lobe, lingula and left lower lobe with chronic mucous plugging. No superimposed edema. No effusion or  pneumothorax. Right upper lobe midlung nodule noted measures 15 mm appearing new since the prior study.  IMPRESSION: Cardiomegaly with vascular congestion  Chronic right middle lobe, lingula, and basilar opacities correlating with known bronchiectasis and probable increased mucus plugging  New right upper lobe 15 mm nodular opacity versus nodule. Consider nonemergent follow-up chest CT for further evaluation  No edema, effusion or pneumothorax   Electronically Signed   By: Daryll Brod M.D.   On: 07/22/2013 17:12   Dg Hip Complete Left  07/22/2013   CLINICAL DATA:  Fall, left hip pain  EXAM: LEFT HIP - COMPLETE 2+ VIEW  COMPARISON:  None.  FINDINGS: There is an acute displaced left hip intertrochanteric fracture with angulation. No associated subluxation or dislocation of the femoral head. Bones appear osteopenic. Right hip intact. Nondisplaced acute fractures also noted of the left superior and inferior rami.  IMPRESSION: Acute displaced and angulated left hip intertrochanteric fracture.  Left superior and inferior rami fractures  Osteopenia   Electronically Signed   By: Daryll Brod M.D.   On: 07/22/2013 17:07   Ct Head Wo Contrast  07/22/2013   CLINICAL DATA:  Fall.  Vomiting after fall.  EXAM: CT HEAD WITHOUT CONTRAST  CT CERVICAL SPINE WITHOUT CONTRAST  TECHNIQUE: Multidetector CT imaging of the head and cervical spine was performed following the standard protocol without intravenous contrast. Multiplanar CT image reconstructions of the cervical spine were also generated.  COMPARISON:  10/13/2005 cervical spine MR. 02/02/2005 brain MR.  FINDINGS: CT HEAD FINDINGS  Fluid level within the sphenoid sinus however, no adjacent skullbase fracture detected. Partial opacification posterior ethmoid sinus air cells.  No intracranial hemorrhage.  Global atrophy. Ventricular prominence felt to be related to atrophy rather than hydrocephalus.  Small vessel disease type changes without CT evidence of large acute  infarct.  No intracranial mass lesion noted on this unenhanced exam.  Right parotid 1.4 cm lesion without significant change since 2006. Etiology indeterminate.  CT CERVICAL SPINE FINDINGS  No cervical spine fracture. Cervical spondylotic changes. Transverse ligament hypertrophy. No abnormal prevertebral soft tissue swelling.  New from the 2007 MR is a T1 superior endplate mild compression fracture with 15% loss of height anteriorly. Age of this is indeterminate.  IMPRESSION: Head CT:  No skull fracture or intracranial hemorrhage.  Global atrophy.  Small vessel disease type changes.  Sphenoid sinus air-fluid level may indicate acute sinusitis. Partial opacification posterior ethmoid sinus air cells.  1.4 cm right parotid lesion unchanged since 2006. Etiology indeterminate.  Cervical spine CT:  No cervical spine fracture.  New from the 2007 MR is a T1 superior endplate mild compression fracture with 15% loss of height anteriorly. Age of this is indeterminate. Acute injury cannot be  excluded. If further delineation is clinically desired, cervical spine MR including the upper thoracic spine can be obtained.  These results were called by telephone at the time of interpretation on 07/22/2013 at 4:52 PM to [facility] , who verbally acknowledged these results.   Electronically Signed   By: Chauncey Cruel M.D.   On: 07/22/2013 16:54   Ct Cervical Spine Wo Contrast  07/22/2013   CLINICAL DATA:  Fall.  Vomiting after fall.  EXAM: CT HEAD WITHOUT CONTRAST  CT CERVICAL SPINE WITHOUT CONTRAST  TECHNIQUE: Multidetector CT imaging of the head and cervical spine was performed following the standard protocol without intravenous contrast. Multiplanar CT image reconstructions of the cervical spine were also generated.  COMPARISON:  10/13/2005 cervical spine MR. 02/02/2005 brain MR.  FINDINGS: CT HEAD FINDINGS  Fluid level within the sphenoid sinus however, no adjacent skullbase fracture detected. Partial opacification posterior  ethmoid sinus air cells.  No intracranial hemorrhage.  Global atrophy. Ventricular prominence felt to be related to atrophy rather than hydrocephalus.  Small vessel disease type changes without CT evidence of large acute infarct.  No intracranial mass lesion noted on this unenhanced exam.  Right parotid 1.4 cm lesion without significant change since 2006. Etiology indeterminate.  CT CERVICAL SPINE FINDINGS  No cervical spine fracture. Cervical spondylotic changes. Transverse ligament hypertrophy. No abnormal prevertebral soft tissue swelling.  New from the 2007 MR is a T1 superior endplate mild compression fracture with 15% loss of height anteriorly. Age of this is indeterminate.  IMPRESSION: Head CT:  No skull fracture or intracranial hemorrhage.  Global atrophy.  Small vessel disease type changes.  Sphenoid sinus air-fluid level may indicate acute sinusitis. Partial opacification posterior ethmoid sinus air cells.  1.4 cm right parotid lesion unchanged since 2006. Etiology indeterminate.  Cervical spine CT:  No cervical spine fracture.  New from the 2007 MR is a T1 superior endplate mild compression fracture with 15% loss of height anteriorly. Age of this is indeterminate. Acute injury cannot be excluded. If further delineation is clinically desired, cervical spine MR including the upper thoracic spine can be obtained.  These results were called by telephone at the time of interpretation on 07/22/2013 at 4:52 PM to [facility] , who verbally acknowledged these results.   Electronically Signed   By: Chauncey Cruel M.D.   On: 07/22/2013 16:54    Positive ROS: All other systems have been reviewed and were otherwise negative with the exception of those mentioned in the HPI and as above.  Physical Exam: General: She is lying on a gurney, in moderate distress, mildly confused, does not really answer questions. She is cachectic. Cardiovascular: No pedal edema Respiratory: Mild use of accessory musculature, mild  cyanosis in her fingers. GI: No organomegaly, abdomen is soft and non-tender Skin: No lesions in the area of chief complaint, with the exception of bruising around her left hip. Neurologic: Sensation intact distally Psychiatric: Patient is not competent for consent, although the daughter is at the bedside and is very informed.  Lymphatic: No axillary or cervical lymphadenopathy  MUSCULOSKELETAL: Left hip is shortened, externally rotated, EHL FHL are firing.  Assessment: Complex unstable left intertrochanteric hip fracture, failure to thrive, malnutrition, osteoporosis, T11 compression fracture, bronchiectasis with chronic mucous plugging  Plan: She is extremely high risk for surgery, and nonsurgical management also carries extremely high risk. I discussed the options with the family, and her daughter is very clear that she wants surgical intervention, despite the high risk for mortality. I  have given her at least a 50% mortality rate in the next 6 months, however there is significant likelihood that she may not survive this hospitalization. The daughter has declined palliative care consultation, however this may be necessary for postoperative management, depending on her clinical condition.  I have discussed her case with internal medicine team, who had indicated that she is as optimized as could be achieved, and surgical delay would not improve the risk profile.  Therefore we'll plan to proceed with surgical intervention probably later on this evening.  The risks benefits and alternatives were discussed with the patient and her daughter including but not limited to the risks of nonoperative treatment, versus surgical intervention including infection, bleeding, nerve injury, malunion, nonunion, the need for revision surgery, hardware prominence, hardware failure, the need for hardware removal, blood clots, cardiopulmonary complications, morbidity, mortality, among others, and they were willing to  proceed.    We will plan for closed management of the T11 compression fracture, and I am concerned that bracing may impair her pulmonary function, and so this will likely be palliative management of her fracture.   Johnny Bridge, MD Cell [phone]   07/22/2013 7:21 PM

## 2013-07-22 NOTE — H&P (Signed)
[facility] History and Physical  Nancy Ponce NAT:[id number] DOB: 11-09-1920 DOA: 07/22/2013  Referring physician: er PCP: Mathews Argyle, MD   Chief Complaint: hip pain after a fall  HPI: Nancy Ponce is a 80 y.o. female who is unable to provide history- obtained from ER records and daughter. She is from [employer] (was in ILF but now with more assistance)  She has a history of osteoporosis, dementia, bronchiectasis.   Today she was getting up out of her wheelchair to lunge at another patient when she fell. She has a deformity of her left leg.  Since Thanksgiving she has been falling more frequently and has had to get around in a wheelchair.  she does participate in PT at the SNF 2 days a week and can ambulate with a walker with supervision Daughter states she has been falling to her side since Thanksgiving.  Had a cardiac work up by PCP in the past 3 years and daughter says no issues were found.  In the ER, she was found to have a hip fracture with mild elevation of her WBC count Denies chest pain- just having a lot of pain in her hip   Review of Systems:  All systems reviewed, negative unless stated above  Past Medical History  Diagnosis Date  . Bronchiectasis without acute exacerbation   . Osteoporosis, unspecified   . Depressive disorder, not elsewhere classified   . Essential and other specified forms of tremor 04/17/2013  . Abnormality of gait 04/17/2013  . Degenerative arthritis   . Cancer     Basal cell carcinoma, right face   Past Surgical History  Procedure Laterality Date  . Breast biopsy    . Rotator cuff    . Total abdominal hysterectomy w/ bilateral salpingoophorectomy    . Cholecystectomy    . Appendectomy    . Mohs surgery    . Tonsillectomy    . Cataract extraction Bilateral   . Rotator cuff tear repair Right    Social History:  reports that she quit smoking about 60 years ago. She does not have any smokeless tobacco history on file. She  reports that she does not drink alcohol or use illicit drugs.  Allergies  Allergen Reactions  . Codeine Other (See Comments)    Per [facility]  . Sulfa Antibiotics Other (See Comments)    Per MAR   . Urecholine [Bethanechol] Other (See Comments)    Per MAR     Family History  Problem Relation Age of Onset  . Emphysema Father   . Heart disease Mother   . Stroke Mother   . Cancer Sister   . Diabetes Sister      Prior to Admission medications   Medication Sig Start Date End Date Taking? Authorizing Provider  ALPRAZolam (XANAX) 0.25 MG tablet Take 0.125 mg by mouth 3 (three) times daily.   Yes Historical Provider, MD  aspirin 81 MG tablet Take 81 mg by mouth daily.    Yes Historical Provider, MD  guaiFENesin (ROBITUSSIN) 100 MG/5ML liquid Take 300 mg by mouth every 4 (four) hours as needed for cough.   Yes Historical Provider, MD  megestrol (MEGACE) 400 MG/10ML suspension Take 200 mg by mouth daily.   Yes Historical Provider, MD  metoprolol succinate (TOPROL XL) 25 MG 24 hr tablet Take 1 tablet (25 mg total) by mouth at bedtime. 06/14/13  Yes Kathrynn Ducking, MD  midodrine (PROAMATINE) 2.5 MG tablet Take 2.5 mg by mouth 3 (three) times  daily.     Yes Historical Provider, MD  mirtazapine (REMERON) 15 MG tablet Take 15 mg by mouth at bedtime.    Yes Historical Provider, MD  sertraline (ZOLOFT) 50 MG tablet Take 50 mg by mouth daily.   Yes Historical Provider, MD   Physical Exam: Filed Vitals:   07/22/13 1745  BP: 128/65  Pulse: 94  Temp:   Resp: 37    BP 128/65  Pulse 94  Temp(Src) 97.7 F (36.5 C) (Oral)  Resp 37  SpO2 91%  General:  Elderly female, moaning in pain Eyes: PERRL, normal lids, irises & conjunctiva ENT: grossly normal hearing, lips & tongue Neck: no LAD, masses or thyromegaly Cardiovascular: RRR, no m/r/g. No LE edema. Respiratory: CTA bilaterally, no w/r/r. Decreased breath sounds Abdomen: soft, ntnd Skin: no rash or induration seen on limited  exam Musculoskeletal: left leg exverted Neurologic: has tremor          Labs on Admission:  Basic Metabolic Panel:  Recent Labs Lab 07/22/13 1550  NA 138  K 5.2  CL 99  CO2 27  GLUCOSE 122*  BUN 32*  CREATININE 0.96  CALCIUM 8.7   Liver Function Tests: No results found for this basename: AST, ALT, ALKPHOS, BILITOT, PROT, ALBUMIN,  in the last 168 hours No results found for this basename: LIPASE, AMYLASE,  in the last 168 hours No results found for this basename: AMMONIA,  in the last 168 hours CBC:  Recent Labs Lab 07/22/13 1550  WBC 16.3*  NEUTROABS 13.0*  HGB 10.4*  HCT 30.5*  MCV 93.3  PLT 302   Cardiac Enzymes: No results found for this basename: CKTOTAL, CKMB, CKMBINDEX, TROPONINI,  in the last 168 hours  BNP (last 3 results) No results found for this basename: PROBNP,  in the last 8760 hours CBG: No results found for this basename: GLUCAP,  in the last 168 hours  Radiological Exams on Admission: Dg Chest 1 View  07/22/2013   CLINICAL DATA:  Fall, left hip fracture  EXAM: CHEST - 1 VIEW  COMPARISON:  03/15/2013  FINDINGS: Stable mild cardiomegaly with central vascular congestion. Hyperinflation noted compatible with background COPD/ emphysema. Patchy and nodular airspace opacities bilaterally in the right mid lung, both hilar regions, and the lower lobes. Some of these opacities correlate with areas of known bronchiectasis in the right middle lobe, lingula and left lower lobe with chronic mucous plugging. No superimposed edema. No effusion or pneumothorax. Right upper lobe midlung nodule noted measures 15 mm appearing new since the prior study.  IMPRESSION: Cardiomegaly with vascular congestion  Chronic right middle lobe, lingula, and basilar opacities correlating with known bronchiectasis and probable increased mucus plugging  New right upper lobe 15 mm nodular opacity versus nodule. Consider nonemergent follow-up chest CT for further evaluation  No edema,  effusion or pneumothorax   Electronically Signed   By: Ruel Favors M.D.   On: 07/22/2013 17:12   Dg Hip Complete Left  07/22/2013   CLINICAL DATA:  Fall, left hip pain  EXAM: LEFT HIP - COMPLETE 2+ VIEW  COMPARISON:  None.  FINDINGS: There is an acute displaced left hip intertrochanteric fracture with angulation. No associated subluxation or dislocation of the femoral head. Bones appear osteopenic. Right hip intact. Nondisplaced acute fractures also noted of the left superior and inferior rami.  IMPRESSION: Acute displaced and angulated left hip intertrochanteric fracture.  Left superior and inferior rami fractures  Osteopenia   Electronically Signed   By: Ruel Favors  M.D.   On: 07/22/2013 17:07   Ct Head Wo Contrast  07/22/2013   CLINICAL DATA:  Fall.  Vomiting after fall.  EXAM: CT HEAD WITHOUT CONTRAST  CT CERVICAL SPINE WITHOUT CONTRAST  TECHNIQUE: Multidetector CT imaging of the head and cervical spine was performed following the standard protocol without intravenous contrast. Multiplanar CT image reconstructions of the cervical spine were also generated.  COMPARISON:  10/13/2005 cervical spine MR. 02/02/2005 brain MR.  FINDINGS: CT HEAD FINDINGS  Fluid level within the sphenoid sinus however, no adjacent skullbase fracture detected. Partial opacification posterior ethmoid sinus air cells.  No intracranial hemorrhage.  Global atrophy. Ventricular prominence felt to be related to atrophy rather than hydrocephalus.  Small vessel disease type changes without CT evidence of large acute infarct.  No intracranial mass lesion noted on this unenhanced exam.  Right parotid 1.4 cm lesion without significant change since 2006. Etiology indeterminate.  CT CERVICAL SPINE FINDINGS  No cervical spine fracture. Cervical spondylotic changes. Transverse ligament hypertrophy. No abnormal prevertebral soft tissue swelling.  New from the 2007 MR is a T1 superior endplate mild compression fracture with 15% loss of height  anteriorly. Age of this is indeterminate.  IMPRESSION: Head CT:  No skull fracture or intracranial hemorrhage.  Global atrophy.  Small vessel disease type changes.  Sphenoid sinus air-fluid level may indicate acute sinusitis. Partial opacification posterior ethmoid sinus air cells.  1.4 cm right parotid lesion unchanged since 2006. Etiology indeterminate.  Cervical spine CT:  No cervical spine fracture.  New from the 2007 MR is a T1 superior endplate mild compression fracture with 15% loss of height anteriorly. Age of this is indeterminate. Acute injury cannot be excluded. If further delineation is clinically desired, cervical spine MR including the upper thoracic spine can be obtained.  These results were called by telephone at the time of interpretation on 07/22/2013 at 4:52 PM to [facility] , who verbally acknowledged these results.   Electronically Signed   By: Chauncey Cruel M.D.   On: 07/22/2013 16:54   Ct Cervical Spine Wo Contrast  07/22/2013   CLINICAL DATA:  Fall.  Vomiting after fall.  EXAM: CT HEAD WITHOUT CONTRAST  CT CERVICAL SPINE WITHOUT CONTRAST  TECHNIQUE: Multidetector CT imaging of the head and cervical spine was performed following the standard protocol without intravenous contrast. Multiplanar CT image reconstructions of the cervical spine were also generated.  COMPARISON:  10/13/2005 cervical spine MR. 02/02/2005 brain MR.  FINDINGS: CT HEAD FINDINGS  Fluid level within the sphenoid sinus however, no adjacent skullbase fracture detected. Partial opacification posterior ethmoid sinus air cells.  No intracranial hemorrhage.  Global atrophy. Ventricular prominence felt to be related to atrophy rather than hydrocephalus.  Small vessel disease type changes without CT evidence of large acute infarct.  No intracranial mass lesion noted on this unenhanced exam.  Right parotid 1.4 cm lesion without significant change since 2006. Etiology indeterminate.  CT CERVICAL SPINE FINDINGS  No cervical spine  fracture. Cervical spondylotic changes. Transverse ligament hypertrophy. No abnormal prevertebral soft tissue swelling.  New from the 2007 MR is a T1 superior endplate mild compression fracture with 15% loss of height anteriorly. Age of this is indeterminate.  IMPRESSION: Head CT:  No skull fracture or intracranial hemorrhage.  Global atrophy.  Small vessel disease type changes.  Sphenoid sinus air-fluid level may indicate acute sinusitis. Partial opacification posterior ethmoid sinus air cells.  1.4 cm right parotid lesion unchanged since 2006. Etiology indeterminate.  Cervical spine CT:  No cervical spine fracture.  New from the 2007 MR is a T1 superior endplate mild compression fracture with 15% loss of height anteriorly. Age of this is indeterminate. Acute injury cannot be excluded. If further delineation is clinically desired, cervical spine MR including the upper thoracic spine can be obtained.  These results were called by telephone at the time of interpretation on 07/22/2013 at 4:52 PM to [facility] , who verbally acknowledged these results.   Electronically Signed   By: Chauncey Cruel M.D.   On: 07/22/2013 16:54    EKG: Independently reviewed. NSR with RBBB  Assessment/Plan Active Problems:   BRONCHIECTASIS   Essential and other specified forms of tremor   Hip fracture   Leukocytosis T1 compression fracture  Hip fracture- protocol implemented, NPO after midnight, await ortho to see and arrange for OR time.  No cardiac history but does have bronchiectasis- does not appear to be having an acute flare Spoke briefly with daughter about not pursuing patient as she has had a decline in her ambulatory status over the last few months but she is interested in pursuing treatment - limit pain meds- unsure of rxn to codeine- will give fentanyl but patient is certainly at risk for delirium especially in light of her dementia  Tremor- at baseline, has seen Dr. Jannifer Franklin in past  Leukocytosis- check U/A,  culture  T1 compression fracture- no intervention, supportive care  Bronchiectasis -follow with Dr. Annamaria Boots -place on O2 for now -does not appear to be an acute flare    ER spoke with NS- no intervention for T1 compression fracture Ortho consulted in ER  Code Status: DNR Family Communication: daughter at bedside Disposition Plan: admit  Time spent: 16 min  Eulogio Bear [facility] Pager [phone]

## 2013-07-22 NOTE — ED Provider Notes (Signed)
Medical screening examination/treatment/procedure(s) were conducted as a shared visit with non-physician practitioner(s) and myself.  I personally evaluated the patient during the encounter.  EKG Interpretation   None       27F s/p fall at nursing home. Was trying to strike another resident. L leg shortened, externally rotated. Pulses intact. Demented. Pain meds given. Xray shows intertroch fx with superior/inferior pubic rami fractures. Ortho to see. Medicine to admit.   Osvaldo Shipper, MD 07/22/13 [phone]

## 2013-07-22 NOTE — Op Note (Signed)
DATE OF SURGERY:  07/22/2013  TIME: 10:44 PM  PATIENT NAME:  Nancy Ponce  AGE: 80 y.o.  PRE-OPERATIVE DIAGNOSIS:  Left Intertrochanteric Fracture  POST-OPERATIVE DIAGNOSIS:  SAME  PROCEDURE:  INTRAMEDULLARY (IM) NAIL INTERTROCHANTRIC  SURGEON:  Fransheska Willingham P  ASSISTANT:  Joya Gaskins, OPA-C, present and scrubbed throughout the case, critical for assistance with exposure, retraction, instrumentation, and closure.  OPERATIVE IMPLANTS: Synthes trochanteric femoral nail with interlocking helical blade into the femoral head with a single distal interlocking bolt.  PREOPERATIVE INDICATIONS:  Nancy Ponce is a 80 y.o. year old who fell and suffered a hip fracture. She was brought into the ER and then admitted and optimized and then elected for surgical intervention.    The risks benefits and alternatives were discussed with the patient and her daughter including but not limited to the risks of nonoperative treatment, versus surgical intervention including infection, bleeding, nerve injury, malunion, nonunion, hardware prominence, hardware failure, need for hardware removal, blood clots, cardiopulmonary complications, morbidity, mortality, among others, and they were willing to proceed.  She had multiple severe risk factors, including malnutrition, dementia, osteoporosis, mucous plugs and pulmonary bronchiectasis, with progressive failure to thrive, in discussion was had with the daughter regarding her high mortality risk, however the daughter was very adamant that she wished her to undergo surgical intervention.  OPERATIVE PROCEDURE:  The patient was brought to the operating room and placed in the supine position. General anesthesia was administered, with a foley. She was placed on the fracture table.  Closed reduction was performed under C-arm guidance. The length of the femur was also measured using fluoroscopy. Time out was then performed after sterile prep and drape. She received  preoperative antibiotics.  Incision was made proximal to the greater trochanter. A guidewire was placed in the appropriate position. Confirmation was made on AP and lateral views. During the positioning, it appeared that I lost some of the reduction on the lateral view, and closed manipulation was not successful, and therefore I I made a slightly larger distal incision in order to accommodate the use of a Cobb elevator to reduce the femoral neck into the anatomic location. The above-named nail was opened. I opened the proximal femur with a reamer. I then placed the nail by hand easily down. I did not need to ream the femur.  Once the nail was completely seated, I placed a guidepin into the femoral head into the center center position. The fracture was extremely unstable, and was effectively a complex four-part intertrochanteric extending into the subtrochanteric region. I measured the length, and then reamed the lateral cortex and up into the head. I then placed the helical blade. Slight compression was applied. Anatomic fixation achieved. Bone quality was poor.  I then secured the proximal interlocking bolt, and took off a half a turn, and then placed a distal interlocking bolt using perfect circles technique, leaving a slight amount of compression/dynamization possible if the shaft should need dynamization at the subtrochanteric region, and then removed the instruments, and took final C-arm pictures AP and lateral the entire length of the leg. Anatomic reconstruction was achieved, and the wounds were irrigated copiously and closed with Vicryl followed by Steri-Strips and sterile gauze for the skin. The patient was awakened and returned to PACU in stable and satisfactory condition. There no complications and the patient tolerated the procedure well.  She will be weightbearing as tolerated, and will be on Lovenox  for a period of three weeks after discharge.   Vonna Kotyk  Luci Bellucci, M.D.

## 2013-07-22 NOTE — Discharge Instructions (Signed)
Diet: As instructed by speech therapy  Shower:  May shower but keep the wounds dry, use an occlusive plastic wrap, NO SOAKING IN TUB.  If the bandage gets wet, change with a clean dry gauze.  Dressing:  You may change your dressing 3-5 days after surgery.  Then change the dressing daily with sterile gauze dressing.    There are sticky tapes (steri-strips) on your wounds and all the stitches are absorbable.  Leave the steri-strips in place when changing your dressings, they will peel off with time, usually 2-3 weeks.  Activity:  Increase activity slowly as tolerated, but follow the weight bearing instructions below.  No lifting or driving for 6 weeks.  Weight Bearing:   As tolerated.    To prevent constipation: you may use a stool softener such as -  Colace (over the counter) 100 mg by mouth twice a day  Drink plenty of fluids (prune juice may be helpful) and high fiber foods Miralax (over the counter) for constipation as needed.    Itching:  If you experience itching with your medications, try taking only a single pain pill, or even half a pain pill at a time.  You may take up to 10 pain pills per day, and you can also use benadryl over the counter for itching or also to help with sleep.   Precautions:  If you experience chest pain or shortness of breath - call 911 immediately for transfer to the hospital emergency department!!  If you develop a fever greater that 101 F, purulent drainage from wound, increased redness or drainage from wound, or calf pain -- Call the office at [phone]                                                Follow- Up Appointment:  Please call for an appointment to be seen in 2 weeks [place] - [phone]

## 2013-07-22 NOTE — Progress Notes (Signed)
Orthopedic Tech Progress Note Patient Details:  Nancy Ponce 28-Jan-1921 [id number] Patient unable to use ohf properly.  Patient ID: Nancy Ponce, female   DOB: 1920/08/01, 80 y.o.   MRN: [id number]   Braulio Bosch 07/22/2013, 8:15 PM

## 2013-07-23 ENCOUNTER — Inpatient Hospital Stay (HOSPITAL_COMMUNITY): Payer: Medicare Other

## 2013-07-23 DIAGNOSIS — N183 Chronic kidney disease, stage 3 unspecified: Secondary | ICD-10-CM | POA: Diagnosis present

## 2013-07-23 DIAGNOSIS — N179 Acute kidney failure, unspecified: Secondary | ICD-10-CM | POA: Diagnosis present

## 2013-07-23 DIAGNOSIS — E860 Dehydration: Secondary | ICD-10-CM | POA: Diagnosis present

## 2013-07-23 DIAGNOSIS — R627 Adult failure to thrive: Secondary | ICD-10-CM | POA: Diagnosis present

## 2013-07-23 DIAGNOSIS — D62 Acute posthemorrhagic anemia: Secondary | ICD-10-CM | POA: Diagnosis present

## 2013-07-23 DIAGNOSIS — S72143A Displaced intertrochanteric fracture of unspecified femur, initial encounter for closed fracture: Principal | ICD-10-CM

## 2013-07-23 DIAGNOSIS — I959 Hypotension, unspecified: Secondary | ICD-10-CM | POA: Diagnosis present

## 2013-07-23 DIAGNOSIS — E875 Hyperkalemia: Secondary | ICD-10-CM | POA: Diagnosis present

## 2013-07-23 LAB — BASIC METABOLIC PANEL
BUN: 35 mg/dL — ABNORMAL HIGH (ref 6–23)
CO2: 25 mEq/L (ref 19–32)
Calcium: 8.7 mg/dL (ref 8.4–10.5)
Chloride: 97 mEq/L (ref 96–112)
Creatinine, Ser: 1.4 mg/dL — ABNORMAL HIGH (ref 0.50–1.10)
GFR calc non Af Amer: 32 mL/min — ABNORMAL LOW (ref >90)
GFR, EST AFRICAN AMERICAN: 37 mL/min — AB (ref >90)
Glucose, Bld: 167 mg/dL — ABNORMAL HIGH (ref 70–99)
Potassium: 5.6 mEq/L — ABNORMAL HIGH (ref 3.7–5.3)
Sodium: 137 mEq/L (ref 137–147)

## 2013-07-23 LAB — TROPONIN I: Troponin I: <0.3 ng/mL (ref <0.30)

## 2013-07-23 LAB — CREATININE, SERUM
CREATININE: 1.39 mg/dL — AB (ref 0.50–1.10)
GFR calc Af Amer: 37 mL/min — ABNORMAL LOW (ref >90)
GFR, EST NON AFRICAN AMERICAN: 32 mL/min — AB (ref >90)

## 2013-07-23 LAB — CBC
HCT: 25 % — ABNORMAL LOW (ref 36.0–46.0)
Hemoglobin: 8.1 g/dL — ABNORMAL LOW (ref 12.0–15.0)
MCH: 30.7 pg (ref 26.0–34.0)
MCHC: 32.4 g/dL (ref 30.0–36.0)
MCV: 94.7 fL (ref 78.0–100.0)
PLATELETS: 216 10*3/uL (ref 150–400)
RBC: 2.64 MIL/uL — AB (ref 3.87–5.11)
RDW: 15 % (ref 11.5–15.5)
WBC: 27.2 10*3/uL — AB (ref 4.0–10.5)

## 2013-07-23 LAB — SODIUM, URINE, RANDOM: Sodium, Ur: <20 mEq/L

## 2013-07-23 LAB — PREPARE RBC (CROSSMATCH)

## 2013-07-23 LAB — MRSA PCR SCREENING: MRSA BY PCR: NEGATIVE

## 2013-07-23 MED ORDER — DOCUSATE SODIUM 100 MG PO CAPS: 100.0000 mg | ORAL_CAPSULE | Freq: Two times a day (BID) | ORAL | Status: DC

## 2013-07-23 MED ORDER — FLEET ENEMA 7-19 GM/118ML RE ENEM: 1.0000 | ENEMA | Freq: Once | RECTAL | Status: AC | PRN

## 2013-07-23 MED ORDER — BISACODYL 10 MG RE SUPP: 10.0000 mg | Freq: Every day | RECTAL | Status: DC | PRN

## 2013-07-23 MED ORDER — BOOST / RESOURCE BREEZE PO LIQD
1.0000 | Freq: Two times a day (BID) | ORAL | Status: DC
  Administered 2013-07-24 – 2013-07-27 (×7): 1 via ORAL

## 2013-07-23 MED ORDER — METOCLOPRAMIDE HCL 5 MG/ML IJ SOLN: 5.0000 mg | Freq: Three times a day (TID) | INTRAMUSCULAR | Status: DC | PRN

## 2013-07-23 MED ORDER — SODIUM CHLORIDE 0.45 % IV SOLN
INTRAVENOUS | Status: DC
Start: 2013-07-23 — End: 2013-07-23
  Administered 2013-07-23: 06:00:00 via INTRAVENOUS

## 2013-07-23 MED ORDER — ACETAMINOPHEN 650 MG RE SUPP: 650.0000 mg | Freq: Four times a day (QID) | RECTAL | Status: DC | PRN

## 2013-07-23 MED ORDER — ENOXAPARIN SODIUM 30 MG/0.3ML ~~LOC~~ SOLN
30.0000 mg | SUBCUTANEOUS | Status: DC
  Administered 2013-07-23 – 2013-07-27 (×5): 30 mg via SUBCUTANEOUS
  Filled 2013-07-23 (×5): qty 0.3

## 2013-07-23 MED ORDER — CEFAZOLIN SODIUM 1-5 GM-% IV SOLN
1.0000 g | Freq: Four times a day (QID) | INTRAVENOUS | Status: AC
  Administered 2013-07-23 (×2): 1 g via INTRAVENOUS
  Filled 2013-07-23 (×2): qty 50

## 2013-07-23 MED ORDER — HYDROCODONE-ACETAMINOPHEN 5-325 MG PO TABS
1.0000 | ORAL_TABLET | Freq: Four times a day (QID) | ORAL | Status: DC | PRN
  Administered 2013-07-24: 1 via ORAL
  Administered 2013-07-25: 2 via ORAL
  Administered 2013-07-26: 1 via ORAL
  Filled 2013-07-23: qty 1
  Filled 2013-07-23: qty 2
  Filled 2013-07-23: qty 1

## 2013-07-23 MED ORDER — SODIUM CHLORIDE 0.9 % IV BOLUS (SEPSIS)
250.0000 mL | Freq: Once | INTRAVENOUS | Status: AC
  Administered 2013-07-23: 250 mL via INTRAVENOUS

## 2013-07-23 MED ORDER — POLYETHYLENE GLYCOL 3350 17 G PO PACK: 17.0000 g | PACK | Freq: Every day | ORAL | Status: DC | PRN

## 2013-07-23 MED ORDER — RESOURCE THICKENUP CLEAR PO POWD
ORAL | Status: DC | PRN
  Filled 2013-07-23 (×2): qty 125

## 2013-07-23 MED ORDER — SENNOSIDES-DOCUSATE SODIUM 8.6-50 MG PO TABS
1.0000 | ORAL_TABLET | Freq: Two times a day (BID) | ORAL | Status: DC
  Administered 2013-07-23 – 2013-07-27 (×7): 1 via ORAL
  Filled 2013-07-23 (×8): qty 1

## 2013-07-23 MED ORDER — SODIUM CHLORIDE 0.9 % IV SOLN
INTRAVENOUS | Status: DC
Start: 2013-07-24 — End: 2013-07-27
  Administered 2013-07-24 – 2013-07-26 (×3): via INTRAVENOUS

## 2013-07-23 MED ORDER — PHENOL 1.4 % MT LIQD: 1.0000 | OROMUCOSAL | Status: DC | PRN

## 2013-07-23 MED ORDER — SODIUM CHLORIDE 0.9 % IV SOLN
INTRAVENOUS | Status: AC
  Administered 2013-07-23 (×2): via INTRAVENOUS

## 2013-07-23 MED ORDER — ONDANSETRON HCL 4 MG PO TABS: 4.0000 mg | ORAL_TABLET | Freq: Four times a day (QID) | ORAL | Status: DC | PRN

## 2013-07-23 MED ORDER — FENTANYL CITRATE 0.05 MG/ML IJ SOLN: 50.0000 ug | Freq: Once | INTRAMUSCULAR | Status: DC

## 2013-07-23 MED ORDER — ACETAMINOPHEN 325 MG PO TABS
650.0000 mg | ORAL_TABLET | Freq: Once | ORAL | Status: AC
  Administered 2013-07-23: 650 mg via ORAL
  Filled 2013-07-23: qty 2

## 2013-07-23 MED ORDER — SENNA 8.6 MG PO TABS
1.0000 | ORAL_TABLET | Freq: Two times a day (BID) | ORAL | Status: DC
  Administered 2013-07-23: 8.6 mg via ORAL
  Filled 2013-07-23 (×2): qty 1

## 2013-07-23 MED ORDER — MENTHOL 3 MG MT LOZG: 1.0000 | LOZENGE | OROMUCOSAL | Status: DC | PRN

## 2013-07-23 MED ORDER — ALUM & MAG HYDROXIDE-SIMETH 200-200-20 MG/5ML PO SUSP
30.0000 mL | ORAL | Status: DC | PRN
  Filled 2013-07-23: qty 30

## 2013-07-23 MED ORDER — METOCLOPRAMIDE HCL 5 MG PO TABS: 5.0000 mg | ORAL_TABLET | Freq: Three times a day (TID) | ORAL | Status: DC | PRN

## 2013-07-23 MED ORDER — ACETAMINOPHEN 325 MG PO TABS: 650.0000 mg | ORAL_TABLET | Freq: Four times a day (QID) | ORAL | Status: DC | PRN

## 2013-07-23 MED ORDER — ONDANSETRON HCL 4 MG/2ML IJ SOLN: 4.0000 mg | Freq: Four times a day (QID) | INTRAMUSCULAR | Status: DC | PRN

## 2013-07-23 NOTE — Progress Notes (Signed)
Physician notified: Thereasa Solo At: 68  Regarding: FYI UOP 45 ml/shift. NS @ 75.

## 2013-07-23 NOTE — Progress Notes (Signed)
Physician notified: 1310 At: [facility]  Regarding: FYI Na+ <20 on UA random. In person.

## 2013-07-23 NOTE — Evaluation (Signed)
Clinical/Bedside Swallow Evaluation Patient Details  Name: Nancy Ponce MRN: [id number] Date of Birth: 07/26/1920  Today's Date: 07/23/2013 Time: 0930-1024 SLP Time Calculation (min): 47 min  Past Medical History:  Past Medical History  Diagnosis Date  . Bronchiectasis without acute exacerbation   . Osteoporosis, unspecified   . Depressive disorder, not elsewhere classified   . Essential and other specified forms of tremor 04/17/2013  . Abnormality of gait 04/17/2013  . Degenerative arthritis   . Cancer     Basal cell carcinoma, right face  . Closed intertrochanteric fracture of left hip 07/22/2013  . Thoracic compression fracture 07/22/2013   Past Surgical History:  Past Surgical History  Procedure Laterality Date  . Breast biopsy    . Rotator cuff    . Total abdominal hysterectomy w/ bilateral salpingoophorectomy    . Cholecystectomy    . Appendectomy    . Mohs surgery    . Tonsillectomy    . Cataract extraction Bilateral   . Rotator cuff tear repair Right    HPI:  She is from [employer] (was in ILF but now with more assistance)  She has a history of osteoporosis, dementia, bronchiectasis.   Today she was getting up out of her wheelchair to lunge at another patient when she fell. She has a deformity of her left leg.  Since Thanksgiving she has been falling more frequently and has had to get around in a wheelchair.  she does participate in PT at the SNF 2 days a week and can ambulate with a walker with supervision   Assessment / Plan / Recommendation Clinical Impression  Patient with probable chronic aspiration, with suspected aspiration of thin liquids, and is at riskt for aspiration or penetration with all other consistencies.  Patient coughs after sips of water, and has audible throat clearing and multiple swallows with all other consistencies.  Daughter agrees that a MBS would be difficult and stressful for her mother at this point, as sitting upright 28 would be painful  s/p hip replacement.  Daughter voiced that she wants quality of life for her mother, and would never choose TF.  She agrees to a mechanical soft diet with nectar thick liquids, and accepts potential risks of aspiration.    Aspiration Risk  Moderate    Diet Recommendation Dysphagia 3 (Mechanical Soft);Nectar-thick liquid   Liquid Administration via: Cup (Daughter will get "sippy cup") Medication Administration: Crushed with puree Supervision: Full supervision/cueing for compensatory strategies Compensations: Slow rate;Small sips/bites;Multiple dry swallows after each bite/sip Postural Changes and/or Swallow Maneuvers: Seated upright 90 degrees    Other  Recommendations Oral Care Recommendations: Oral care Q4 per protocol;Staff/trained caregiver to provide oral care Other Recommendations: Order thickener from pharmacy;Clarify dietary restrictions   Follow Up Recommendations  Skilled Nursing facility    Frequency and Duration min 2x/week  2 weeks   Pertinent Vitals/Pain afebrile    SLP Swallow Goals  see care plan    Swallow Study Prior Functional Status       General HPI: She is from [employer] (was in ILF but now with more assistance)  She has a history of osteoporosis, dementia, bronchiectasis.   Today she was getting up out of her wheelchair to lunge at another patient when she fell. She has a deformity of her left leg.  Since Thanksgiving she has been falling more frequently and has had to get around in a wheelchair.  she does participate in PT at the SNF 2 days a week and  can ambulate with a walker with supervision Type of Study: Bedside swallow evaluation Previous Swallow Assessment: none found in system Behavior/Cognition: Alert;Cooperative;Requires cueing;Hard of hearing (Hears best in right ear.) Oral Cavity - Dentition: Adequate natural dentition Self-Feeding Abilities: Needs set up Patient Positioning: Upright in bed Baseline Vocal Quality: Clear Volitional Cough:  Weak Volitional Swallow: Able to elicit    Oral/Motor/Sensory Function Overall Oral Motor/Sensory Function: Appears within functional limits for tasks assessed   Ice Chips Ice chips: Within functional limits Presentation: Spoon   Thin Liquid Thin Liquid: Impaired Presentation: Spoon;Cup;Straw Pharyngeal  Phase Impairments: Suspected delayed Swallow;Multiple swallows;Cough - Immediate    Nectar Thick Nectar Thick Liquid: Impaired Presentation: Spoon;Cup Pharyngeal Phase Impairments: Suspected delayed Swallow;Multiple swallows;Throat Clearing - Immediate   Honey Thick Honey Thick Liquid: Not tested   Puree Puree: Impaired Pharyngeal Phase Impairments: Multiple swallows;Throat Clearing - Immediate   Solid   GO    Solid: Impaired Presentation: Self Fed Pharyngeal Phase Impairments: Throat Clearing - Immediate       Nancy Ponce T 07/23/2013,10:24 AM

## 2013-07-23 NOTE — Progress Notes (Signed)
Orthopedic Tech Progress Note Patient Details:  Nancy Ponce 02-07-1921 [id number]  Patient ID: Nancy Ponce, female   DOB: 1921-04-27, 80 y.o.   MRN: [id number] rn stated that pt is unable to use ohf  Hildred Priest 07/23/2013, 3:02 PM

## 2013-07-23 NOTE — Progress Notes (Signed)
Patient oriented to unit and educated on safety measures upon arrival.  Family at bedside.  Notified that patient needed to be taken to OR for hip surgery.  Obtained Surgical MRSA screening and completed as much of the pre-procedure checklist as possible.  Consent was obtained, and patient was taken via bed down to OR.  Family accompanied patient and took belongings of patient with her.

## 2013-07-23 NOTE — Progress Notes (Signed)
     Subjective:  Patient reports pain as mild.  She does not really verbalize, but seems at baseline.  Low bp overnight.    Objective:   VITALS:   Filed Vitals:   07/23/13 0500 07/23/13 0515 07/23/13 0700 07/23/13 0745  BP: 83/41 86/47 84/41    Pulse: 83 84 67   Temp:    98.4 F (36.9 C)  TempSrc:    Axillary  Resp: 15 19 15    Height:      Weight:      SpO2: 99% 98% 100%     Neurologically intact Dorsiflexion/Plantar flexion intact Incision: scant drainage   Lab Results  Component Value Date   WBC 27.2* 07/23/2013   HGB 8.1* 07/23/2013   HCT 25.0* 07/23/2013   MCV 94.7 07/23/2013   PLT 216 07/23/2013     Assessment/Plan: 1 Day Post-Op   Principal Problem:   Closed intertrochanteric fracture of left hip Active Problems:   BRONCHIECTASIS   Essential and other specified forms of tremor   Leukocytosis   Thoracic compression fracture   Fracture, intertrochanteric, left femur   Up with therapy Discharge to SNF likely wed ABLA will transfuse 1 unit, as I expect further drop in next 24-48 hrs.  Mild hyperkalemia, will dc k in ivfluids, and defer to medicine.   Teana Lindahl P 07/23/2013, 8:04 AM   Marchia Bond, MD Cell [phone]

## 2013-07-23 NOTE — Progress Notes (Signed)
Utilization review completed.  

## 2013-07-23 NOTE — Progress Notes (Signed)
Pt urine output is 12.5 cc last 4 hours  Blood pressures 80's/ 40's, potassium on morning labs is 5.6 and patient has supplemental kcl with IVF R Reader paged and a 250 cc bolus order and IVF changed to .45 ns with no supplement will continue to monitor

## 2013-07-23 NOTE — Plan of Care (Signed)
Problem: Phase II Progression Outcomes Goal: CMS/Neurovascular status WDL Outcome: Progressing Letf pedalt Pulse is dopplerable,, alert is  Not vocal at this time

## 2013-07-23 NOTE — Anesthesia Postprocedure Evaluation (Signed)
  Anesthesia Post-op Note  Patient: Nancy Ponce  Procedure(s) Performed: Procedure(s): INTRAMEDULLARY (IM) NAIL INTERTROCHANTRIC (Left)  Patient Location: PACU  Anesthesia Type:General  Level of Consciousness: awake  Airway and Oxygen Therapy: Patient Spontanous Breathing  Post-op Pain: mild  Post-op Assessment: Post-op Vital signs reviewed, Patient's Cardiovascular Status Stable, Respiratory Function Stable, Patent Airway, No signs of Nausea or vomiting and Pain level controlled  Post-op Vital Signs: Reviewed and stable  Complications: No apparent anesthesia complications

## 2013-07-23 NOTE — Progress Notes (Signed)
Nancy Ponce - Stepdown / ICU Progress Note  Nancy Ponce RWE:[id number] DOB: 10/27/1920 DOA: Ponce/25/2015 PCP: Mathews Argyle, MD  Brief narrative: 80 year old female patient resident of assisted-living facility primarily wheelchair-bound. Apparently was in her wheelchair when she lunged forward and fell. Was noted to have deformity of left leg. She is able to ambulate with a walker with direct supervision and must be assisted to get up to the standing position.  Upon evaluation in ER she was found to have mild leukocytosis and x-ray confirmed hip fracture. Orthopedic service was consulted.  Assessment/Plan:    Closed intertrochanteric fracture of left hip -Stable postop -Per orthopedic team from a postoperative standpoint ready for discharge Wednesday    Dehydration -Suspect was somewhat dehydrated prior to her fall and stress of surgery further exacerbated -Continue IV fluid but since receiving blood decrease rate from 125 to 75 to 50 then to [facility]    Postoperative anemia due to acute blood loss  -Combination of blood loss from surgery as well as hemodilution after rehydration -Agree with transfusion ordered by orthopedics service    Leukocytosis -Likely reactive in nature related to recent trauma and stress of surgery as well as dehydration hypoperfusion from hypotension -Follow    Hypotension -Due to dehydration and anemia which is now symptomatic (see above)    CKD stage III / AKI (acute kidney injury) -Multifactorial from hypoperfusion from hypotension    Hyperkalemia -Secondary to acute kidney injury and dehydration -Follow labs    Thoracic compression fracture    FTT (failure to thrive) in adult -According to patient's daughter has had progressive functional decline over the past year most notably since Thanksgiving of 2014 -Currently is DO NOT RESUSCITATE    BRONCHIECTASIS/mild hypoxemic respiratory failure -Based on chest x-ray appears to be chronic  and primarily on right side could be sign of chronic aspiration -SLP eval for appropriate diet consistency but would not pursue MBSS at this time -Continue supportive care with oxygen    Essential tremor   DVT prophylaxis: Lovenox Code Status: DO NOT RESUSCITATE Family Communication: Daughter at bedside Disposition Plan/Expected LOS: Stepdown  Consultants: Orthopedics  Procedures: Left intramedullary nail/intertrochanteric for left fracture  Dr. Mardelle Matte  Antibiotics: Ancef Ponce/25 >>>  HPI/Subjective: Patient alert but confused yet pleasant. No apparent complaints.  Objective: Blood pressure 129/66, pulse 98, temperature 98.5 F (36.9 C), temperature source Axillary, resp. rate 30, height 5\' 6"  (Ponce.676 m), weight 94 lb 9.2 oz (42.9 kg), SpO2 91.00%.  Intake/Output Summary (Last 24 hours) at 07/23/13 1330 Last data filed at 07/23/13 1249  Gross per 24 hour  Intake 1807.5 ml  Output    560 ml  Net 1247.5 ml   Exam: General: No acute respiratory distress Lungs: Coarse to auscultation bilaterally without wheezes or crackles, decreased bases, Ponce L Cardiovascular: Regular rate and rhythm without murmur gallop or rub normal S1 and S2, no peripheral edema or JVD Abdomen: Nontender, nondistended, soft, bowel sounds positive, no rebound, no ascites, no appreciable mass Musculoskeletal: No significant cyanosis, clubbing of bilateral lower extremities Neurological: Awake and oriented x name only, moves all extremities x 4 without focal neurological deficits of the left leg slightly weaker due to recent hip fracture repair and expected postoperative pain  Scheduled Meds:  Scheduled Meds: .  ceFAZolin (ANCEF) IV  Ponce g Intravenous Q6H  . docusate sodium  100 mg Oral BID  . enoxaparin (LOVENOX) injection  30 mg Subcutaneous Q24H  . feeding supplement (RESOURCE BREEZE)  Ponce  Container Oral BID BM  . midodrine  2.5 mg Oral TID WC  . mirtazapine  15 mg Oral QHS  . senna  Ponce tablet Oral BID  .  sertraline  50 mg Oral Daily   Data Reviewed: Basic Metabolic Panel:  Recent Labs Lab 07/22/13 1550 07/23/13 0308  NA 138 137  K 5.2 5.6*  CL 99 97  CO2 27 25  GLUCOSE 122* 167*  BUN 32* 35*  CREATININE 0.96 Ponce.40*  Ponce.39*  CALCIUM 8.7 8.7   Liver Function Tests: No results found for this basename: AST, ALT, ALKPHOS, BILITOT, PROT, ALBUMIN,  in the last 168 hours  CBC:  Recent Labs Lab 07/22/13 1550 07/23/13 0308  WBC 16.3* 27.2*  NEUTROABS 13.0*  --   HGB 10.4* 8.Ponce*  HCT 30.5* 25.0*  MCV 93.3 94.7  PLT 302 216   Cardiac Enzymes:  Recent Labs Lab 07/22/13 0005  TROPONINI <0.30    Recent Results (from the past 240 hour(s))  SURGICAL PCR SCREEN     Status: None   Collection Time    07/22/13  8:22 PM      Result Value Range Status   MRSA, PCR NEGATIVE  NEGATIVE Final   Staphylococcus aureus NEGATIVE  NEGATIVE Final   Comment:            The Xpert SA Assay (FDA     approved for NASAL specimens     in patients over 28 years of age),     is one component of     a comprehensive surveillance     program.  Test performance has     been validated by [employer] for patients greater     than or equal to 4 year old.     It is not intended     to diagnose infection nor to     guide or monitor treatment.  MRSA PCR SCREENING     Status: None   Collection Time    07/23/13  5:08 AM      Result Value Range Status   MRSA by PCR NEGATIVE  NEGATIVE Final   Comment:            The GeneXpert MRSA Assay (FDA     approved for NASAL specimens     only), is one component of a     comprehensive MRSA colonization     surveillance program. It is not     intended to diagnose MRSA     infection nor to guide or     monitor treatment for     MRSA infections.     Studies:  Recent x-ray studies have been reviewed in detail by the Attending Physician  Time spent :     [name], [place] [facility] Office  [phone] Pager [phone]  **If unable  to reach the above provider after paging please contact the [facility] @ [phone]  On-Call/Text Page:      [email or web address]      password [email or web address]  If 7PM-7AM, please contact night-coverage [email or web address] Password [email or web address] Ponce/26/2015, Ponce:30 PM   LOS: Ponce day   I have personally examined this patient and reviewed the entire database. I have reviewed the above note, made any necessary editorial changes, and agree with its content.  Cherene Altes, MD [facility]

## 2013-07-23 NOTE — Progress Notes (Addendum)
INITIAL NUTRITION ASSESSMENT  DOCUMENTATION CODES Per approved criteria  -Severe malnutrition in the context of chronic illness -Underweight   INTERVENTION: Diet texture and liquid consistency per SLP. Add Resource Breeze po BID, each supplement provides 250 kcal and 9 grams of protein. Please thicken to appropriate consistency. Add Magic cup TID with meals, each supplement provides 290 kcal and 9 grams of protein. RD to continue to follow nutrition care plan.  NUTRITION DIAGNOSIS: Increased nutrient needs related to post-op healing as evidenced by estimated needs.   Goal: Intake to meet at least 90% of estimated needs.  Monitor:  weight trends, lab trends, I/O's  Reason for Assessment: MD Consult for Hip Fracture Protocol  80 y.o. female  Admitting Dx: Closed intertrochanteric fracture of left hip  ASSESSMENT: PMHx significant for osteoporosis, dementia and bronchiectasis. Admitted s/p fall from wheelchair at [facility]. Work-up reveals complex unstable left intertrochanteric hip fracture.  Pt noted to have progressive ambulatory functional declined since Thanksgiving. Pt has also had a decrease in oral intake as well. Pt evaluated by ortho on 1/25 and pt was found to be at high risk for surgery, however daughter was very clear that she wanted pt to pursue surgery. Underwent IM nail 1/25.  Pt noted to have continued hypotension and low UOP. MD asking if pt has chronic aspiration, have asked SLP to see pt. SLP evaluated, and put pt on Dysphagia 3 diet with Nectar Thickened Liquids.  Nutrition hx obtained by dtr at bedside. Pt with chronic weight loss x 5 years. Since Thanksgiving, oral intake has declined. Pt with tremors and has difficulty feeding self, and refuses to be fed. Pt will eat bites at meals, but doesn't typically refuse meals. Has had Ensure Complete in the past but per dtr, pt began to not tolerate and it gave her diarrhea. Dtr agreeable to trying [employer]. Also would  like [employer].  Nutrition Focused Physical Exam:  Subcutaneous Fat:  Orbital Region: severe depletion Upper Arm Region: severe depletion Thoracic and Lumbar Region: severe depletion  Muscle:  Temple Region: severe depletion Clavicle Bone Region: severe depletion Clavicle and Acromion Bone Region: severe depletion Scapular Bone Region: severe depletion Dorsal Hand: severe depletion Patellar Region: severe depletion Anterior Thigh Region: severe depletion Posterior Calf Region: severe depletion  Edema: none  Pt meets criteria for severe MALNUTRITION in the context of chronic illness as evidenced by severe fat and muscle mass loss, intake of <75% x at least 1 month.   Height: Ht Readings from Last 1 Encounters:  07/23/13 5\' 6"  (1.676 m)    Weight: Wt Readings from Last 1 Encounters:  07/23/13 94 lb 9.2 oz (42.9 kg)    Ideal Body Weight: 130 lb  % Ideal Body Weight: 72%  Wt Readings from Last 10 Encounters:  07/23/13 94 lb 9.2 oz (42.9 kg)  07/23/13 94 lb 9.2 oz (42.9 kg)  04/17/13 92 lb (41.731 kg)  03/15/13 91 lb 3.2 oz (41.368 kg)  02/05/13 93 lb 8 oz (42.411 kg)  12/10/10 97 lb 12.8 oz (44.362 kg)  06/10/10 100 lb 2.1 oz (45.419 kg)  01/29/10 97 lb (43.999 kg)  07/31/09 101 lb 8 oz (46.04 kg)  03/06/09 112 lb (50.803 kg)    Usual Body Weight: 90 -95 lb  % Usual Body Weight: 100%   BMI:  Body mass index is 15.27 kg/(m^2). Underweight  Estimated Nutritional Needs: Kcal: 1500 - 1650 Protein: 55 - 65 g Fluid: 1.5 - 1.7 liters  Skin:  L hip  incision L thigh incision L knee incision  Diet Order: Dysphagia 3; Nectar   EDUCATION NEEDS: -No education needs identified at this time   Intake/Output Summary (Last 24 hours) at 07/23/13 1100 Last data filed at 07/23/13 1000  Gross per 24 hour  Intake 1662.5 ml  Output    550 ml  Net 1112.5 ml    Last BM: PTA  Labs:   Recent Labs Lab 07/22/13 1550 07/23/13 0308  NA 138 137  K 5.2 5.6*  CL  99 97  CO2 27 25  BUN 32* 35*  CREATININE 0.96 1.40*  1.39*  CALCIUM 8.7 8.7  GLUCOSE 122* 167*    CBG (last 3)  No results found for this basename: GLUCAP,  in the last 72 hours  Scheduled Meds: .  ceFAZolin (ANCEF) IV  1 g Intravenous Q6H  . docusate sodium  100 mg Oral BID  . enoxaparin (LOVENOX) injection  30 mg Subcutaneous Q24H  . midodrine  2.5 mg Oral TID WC  . mirtazapine  15 mg Oral QHS  . senna  1 tablet Oral BID  . sertraline  50 mg Oral Daily    Continuous Infusions: . sodium chloride 125 mL/hr at 07/23/13 0539    Past Medical History  Diagnosis Date  . Bronchiectasis without acute exacerbation   . Osteoporosis, unspecified   . Depressive disorder, not elsewhere classified   . Essential and other specified forms of tremor 04/17/2013  . Abnormality of gait 04/17/2013  . Degenerative arthritis   . Cancer     Basal cell carcinoma, right face  . Closed intertrochanteric fracture of left hip 07/22/2013  . Thoracic compression fracture 07/22/2013    Past Surgical History  Procedure Laterality Date  . Breast biopsy    . Rotator cuff    . Total abdominal hysterectomy w/ bilateral salpingoophorectomy    . Cholecystectomy    . Appendectomy    . Mohs surgery    . Tonsillectomy    . Cataract extraction Bilateral   . Rotator cuff tear repair Right     Inda Coke MS, RD, LDN Pager: [phone] After-hours pager: [phone]

## 2013-07-23 NOTE — Evaluation (Signed)
Physical Therapy Evaluation Patient Details Name: Nancy Ponce MRN: [id number] DOB: [birth year] Today's Date: 07/23/2013 Time: 9381-0175 PT Time Calculation (min): 16 min  PT Assessment / Plan / Recommendation History of Present Illness  She is from [place] [place] (was in ILF but now with more assistance)  She has a history of osteoporosis, dementia, bronchiectasis.   Today she was getting up out of her wheelchair to lunge at another patient when she fell. She has a deformity of her left leg.  Since Thanksgiving she has been falling more frequently and has had to get around in a wheelchair.  she does participate in PT at the SNF 2 days a week and can ambulate with a walker with supervision  Clinical Impression  Pt mobility greatly limited by L hip pain. Pt currently requiring maximum assist for all mobility and is unable to tolerate standing this date. Pt unsafe to return to ILF apartment and would benefit from SNF to allow pt to achieve safe mod I function to transition back to [place] apartment.    PT Assessment  Patient needs continued PT services    Follow Up Recommendations  SNF    Does the patient have the potential to tolerate intense rehabilitation      Barriers to Discharge        Equipment Recommendations  None recommended by PT    Recommendations for Other Services     Frequency Min 3X/week    Precautions / Restrictions Precautions Precautions: Fall Restrictions Weight Bearing Restrictions: Yes LLE Weight Bearing: Weight bearing as tolerated   Pertinent Vitals/Pain Unable to rate, c/o L hip pain      Mobility  Bed Mobility Overal bed mobility: Needs Assistance;+2 for physical assistance Bed Mobility: Supine to Sit;Sit to Supine Supine to sit: Total assist;+2 for physical assistance Sit to supine: Total assist;+2 for physical assistance General bed mobility comments: assist for trunk elevation and LE management, pt with no initiation of transfer Transfers Overall  transfer level:  (not tested)    Exercises Other Exercises Other Exercises: attempted L knee flexion pt with minimal tolerance, able to complete ankle pumps   PT Diagnosis: Difficulty walking;Acute pain  PT Problem List: Decreased strength;Decreased range of motion;Decreased activity tolerance;Decreased balance;Decreased mobility;Decreased coordination PT Treatment Interventions: DME instruction;Gait training;Functional mobility training;Therapeutic activities;Therapeutic exercise     PT Goals(Current goals can be found in the care plan section) Acute Rehab PT Goals PT Goal Formulation: With patient Time For Goal Achievement: 08/06/13 Potential to Achieve Goals: Fair  Visit Information  Last PT Received On: 07/23/13 Assistance Needed: +2 History of Present Illness: She is from [place] (was in [place] but now with more assistance)  She has a history of osteoporosis, dementia, bronchiectasis.   Today she was getting up out of her wheelchair to lunge at another patient when she fell. She has a deformity of her left leg.  Since Thanksgiving she has been falling more frequently and has had to get around in a wheelchair.  she does participate in PT at the SNF 2 days a week and can ambulate with a walker with supervision       Prior [place] expects to be discharged to:: Unsure Additional Comments: pt from [place] but requires SNF Prior Function Level of Independence: Needs assistance Gait / Transfers Assistance Needed: pt was walking with RW until thanksgiving and now is more w/c bound and only ambulates with PT 2x/wk at [place] ADL's / Homemaking Assistance  Needed: needs assist Communication Communication: HOH Dominant Hand: Right    Cognition  Cognition Arousal/Alertness: Awake/alert Behavior During Therapy: Anxious Overall Cognitive Status: History of cognitive impairments - at baseline (pt with known dementia, aware she's in the  hospital)    Extremity/Trunk Assessment Upper Extremity Assessment Upper Extremity Assessment: Generalized weakness Lower Extremity Assessment Lower Extremity Assessment: Generalized weakness;LLE deficits/detail LLE Deficits / Details: greatly limited by pain, tolerated 30 deg of L knee flexion at EOB Cervical / Trunk Assessment Cervical / Trunk Assessment: Kyphotic   Balance Balance Overall balance assessment: Needs assistance Sitting-balance support: No upper extremity supported;Feet supported Sitting balance-Leahy Scale: Zero Sitting balance - Comments: sat EOB x 5 min. pt with L hip pain and anxiety. pt unable to tolerate L knee bending. posterior lean  End of Session PT - End of Session Activity Tolerance: Patient limited by pain Patient left: in bed;with call bell/phone within reach Nurse Communication: Mobility status  GP     Kingsley Callander 07/23/2013, 4:00 PM  Kittie Plater, PT, DPT Pager #: [phone] Office #: [phone]

## 2013-07-23 NOTE — Plan of Care (Signed)
Chart reviewed. Pt with cont'd hypotension and low UOP along with findings c/w AKI. Have dc'd Lopressor, changed IVFs to NS and increased rate to 125/hr- will also give bolus 250cc. Also noted with hypoxemia-admit CXR with chronic right side brochiectasis so ?? Chronic aspiration- ask SLP to see. WBC up dramatically >27,000 but suspect stressors from recent surgery and persistent hypotension as culprit but will repeat CXR; UA unrevealing yesterday.  [name], ANP

## 2013-07-24 DIAGNOSIS — N179 Acute kidney failure, unspecified: Secondary | ICD-10-CM

## 2013-07-24 LAB — BASIC METABOLIC PANEL
BUN: 50 mg/dL — ABNORMAL HIGH (ref 6–23)
CHLORIDE: 100 meq/L (ref 96–112)
CO2: 25 mEq/L (ref 19–32)
Calcium: 8 mg/dL — ABNORMAL LOW (ref 8.4–10.5)
Creatinine, Ser: 2.15 mg/dL — ABNORMAL HIGH (ref 0.50–1.10)
GFR calc Af Amer: 22 mL/min — ABNORMAL LOW (ref >90)
GFR, EST NON AFRICAN AMERICAN: 19 mL/min — AB (ref >90)
Glucose, Bld: 103 mg/dL — ABNORMAL HIGH (ref 70–99)
Potassium: 5.3 mEq/L (ref 3.7–5.3)
SODIUM: 136 meq/L — AB (ref 137–147)

## 2013-07-24 LAB — URINE CULTURE
COLONY COUNT: NO GROWTH
Culture: NO GROWTH

## 2013-07-24 LAB — CBC
HCT: 22.8 % — ABNORMAL LOW (ref 36.0–46.0)
HEMOGLOBIN: 7.8 g/dL — AB (ref 12.0–15.0)
MCH: 30.7 pg (ref 26.0–34.0)
MCHC: 34.2 g/dL (ref 30.0–36.0)
MCV: 89.8 fL (ref 78.0–100.0)
Platelets: 175 10*3/uL (ref 150–400)
RBC: 2.54 MIL/uL — ABNORMAL LOW (ref 3.87–5.11)
RDW: 16.2 % — AB (ref 11.5–15.5)
WBC: 24 10*3/uL — ABNORMAL HIGH (ref 4.0–10.5)

## 2013-07-24 LAB — PREPARE RBC (CROSSMATCH)

## 2013-07-24 MED ORDER — ACETAMINOPHEN 325 MG PO TABS
650.0000 mg | ORAL_TABLET | Freq: Once | ORAL | Status: AC
  Administered 2013-07-24: 650 mg via ORAL
  Filled 2013-07-24: qty 2

## 2013-07-24 NOTE — Progress Notes (Signed)
Physician notified: A. Lissa Merlin, PA At: [phone]  Regarding: Coarse crackles noted in LLL. NS to increase to 75 Awaiting return response.   Returned Response at: 0751  Order(s): Increase to 75. Pt has decreased UOP with foley, chronic bronchiatisis, CXR looked good.

## 2013-07-24 NOTE — Progress Notes (Signed)
     Subjective:  Patient reports pain as moderate.  Mental status appears baseline.  Verbal, but not very interactive.    Objective:   VITALS:   Filed Vitals:   07/24/13 0600 07/24/13 0700 07/24/13 0728 07/24/13 0731  BP: 130/52 123/69    Pulse: 94 88    Temp:   98.3 F (36.8 C)   TempSrc:   Axillary   Resp: 22 21  20   Height:      Weight:      SpO2: 96% 96%  95%    Neurologically intact Dorsiflexion/Plantar flexion intact Incision: scant drainage   Lab Results  Component Value Date   WBC 24.0* 07/24/2013   HGB 7.8* 07/24/2013   HCT 22.8* 07/24/2013   MCV 89.8 07/24/2013   PLT 175 07/24/2013     Assessment/Plan: 2 Days Post-Op   Active Problems:   BRONCHIECTASIS   Essential and other specified forms of tremor   Closed intertrochanteric fracture of left hip   Leukocytosis   Thoracic compression fracture   Dehydration   Hypotension   FTT (failure to thrive) in adult   CKD (chronic kidney disease), stage III   AKI (acute kidney injury)   Hyperkalemia   Postoperative anemia due to acute blood loss   Advance diet Up with therapy Discharge to SNF ABLA will order one more unit of pRBCs.  May also help renal function.  Recheck labs in am.  dispo per primary team, possibly wed vs. thurs depending on medical issues.   Manjinder Breau P 07/24/2013, 10:20 AM   Marchia Bond, MD Cell [phone]

## 2013-07-24 NOTE — Progress Notes (Signed)
Pt has less than 90 cc of urine out in the last 24 hours with a foley cathter, orignal fluid order was to decrease normal saline to cc hour. Paged Alanda Slim with above information no new orders given.

## 2013-07-24 NOTE — Progress Notes (Signed)
Speech Language Pathology Discharge Patient Details Name: HELMA ARGYLE MRN: [id number] DOB: 03/20/1921 Today's Date: 07/24/2013 Time: 1245-1310 SLP Time Calculation (min): 25 min   Patient discharged from SLP services secondary to goals met and no further SLP needs identified.  Please see latest therapy progress note for current level of functioning and progress toward goals.    Progress and discharge plan discussed with patient and/or caregiver: Patient/Caregiver agrees with plan  Clinical Impressions Pt continues to exhibit overt s/s of aspiration, per BSE. Daughter brought in sippy cup per SLP recommendations to help control size of nectar thick liquid bolus. Daughter fed pt noon meal. Educated daughter re: need for SNF to f/u with nectar thick liquids to reduce amount of aspiration. Instructed daughter that water, soups, fruits, cereals would also need to be nectar thick. Daughter demonstrates good awareness and follow through with diet modifications and swallowing strategies. I re-educated her re: risk of aspiration pna. She accepts this risks and wishes for her mom continue least restricitive PO.  In light of this, recommend d/c from [place] and ST follow up at [facility].  GO     Lovvorn, Annye Rusk 07/24/2013, 1:32 PM

## 2013-07-24 NOTE — Progress Notes (Signed)
[facility]  Nancy Ponce BTD:[id number] DOB: [birth year] DOA: 07/22/2013 PCP: Mathews Argyle, MD  Brief narrative: 80 year old female patient resident of assisted-living facility primarily wheelchair-bound. Apparently was in her wheelchair when she lunged forward and fell. Was noted to have deformity of left leg. She is able to ambulate with a walker with direct supervision and must be assisted to get up to the standing position.  Upon evaluation in ER she was found to have mild leukocytosis and x-ray confirmed hip fracture. Orthopedic service was consulted.  Assessment/Plan:    Closed intertrochanteric fracture of left hip -Stable postop -Per orthopedic team from a postoperative standpoint ready for discharge Wednesday    Dehydration -Suspect was somewhat dehydrated prior to her fall and stress of surgery further exacerbated -Continued IV fluid but since did receive blood decreased rate from 125 to 75 to 50 then to KVO-unfortunately UOP dropped off and BUN/Cr increased so IVFs increased back to 75    Postoperative anemia due to acute blood loss  -Combination of blood loss from surgery as well as hemodilution after rehydration -Hgb 7.8 despite 2 units PRBCs 1/26- suspect dilutional component and may have been quite low pre admit and was masked by severe DH    Leukocytosis - UA negative  -Likely reactive in nature related to recent trauma and stress of surgery as well as dehydration hypoperfusion from hypotension - ? aspiration -Trending up without fever    Hypotension -Due to dehydration and anemia which was now symptomatic (see above) -resolved -on chronic Midodrine    CKD stage III / AKI (acute kidney injury) -Multifactorial from hypoperfusion from hypotension-progressively worse- ?? ATN from low perfusion- no offending meds and did not receive IV contrast    Hyperkalemia -Secondary to acute kidney injury and dehydration -Follow  labs    Thoracic compression fracture    FTT (failure to thrive) in adult -According to patient's daughter has had progressive functional decline over the past year most notably since Thanksgiving of 2014 -Currently is DO NOT RESUSCITATE    BRONCHIECTASIS/mild hypoxemic respiratory failure -Based on chest x-ray appears to be chronic and primarily on right side could be sign of chronic aspiration -SLP eval for appropriate diet consistency but would not pursue MBSS at this time -Continue supportive care with oxygen    Essential tremor   DVT prophylaxis: Lovenox Code Status: DO NOT RESUSCITATE Family Communication: Daughter at bedside Disposition Plan/Expected LOS: Stepdown  Consultants: Orthopedics  Procedures: Left intramedullary nail/intertrochanteric for left fracture  Dr. Mardelle Matte  Antibiotics: Ancef 1/25 >>>  HPI/Subjective: Patient alert -dtr at bedside assisting pt with meal (feeding pt)- note constant throat clearing with eating  Objective: Blood pressure 123/69, pulse 88, temperature 98.3 F (36.8 C), temperature source Axillary, resp. rate 20, height 5\' 6"  (1.676 m), weight 94 lb 9.2 oz (42.9 kg), SpO2 95.00%.  Intake/Output Summary (Last 24 hours) at 07/24/13 1050 Last data filed at 07/24/13 0900  Gross per 24 hour  Intake 1690.5 ml  Output    265 ml  Net 1425.5 ml   Exam: General: No acute respiratory distress Lungs: Coarse to auscultation bilaterally without wheezes or crackles, decreased bases, 1.5 L-upper airway congestion primarily throat Cardiovascular: Regular rate and rhythm without murmur gallop or rub normal S1 and S2, no peripheral edema or JVD Abdomen: Nontender, nondistended, soft, bowel sounds positive, no rebound, no ascites, no appreciable mass Musculoskeletal: No significant cyanosis, clubbing of bilateral lower extremities Neurological: Awake and oriented  x name only, moves all extremities x 4 without focal neurological deficits of the left  leg slightly weaker due to recent hip fracture repair and expected postoperative pain  Scheduled Meds:  Scheduled Meds: . acetaminophen  650 mg Oral Once  . enoxaparin (LOVENOX) injection  30 mg Subcutaneous Q24H  . feeding supplement (RESOURCE BREEZE)  1 Container Oral BID BM  . midodrine  2.5 mg Oral TID WC  . mirtazapine  15 mg Oral QHS  . senna-docusate  1 tablet Oral BID  . sertraline  50 mg Oral Daily   Data Reviewed: Basic Metabolic Panel:  Recent Labs Lab 07/22/13 1550 07/23/13 0308 07/24/13 0325  NA 138 137 136*  K 5.2 5.6* 5.3  CL 99 97 100  CO2 27 25 25   GLUCOSE 122* 167* 103*  BUN 32* 35* 50*  CREATININE 0.96 1.40*  1.39* 2.15*  CALCIUM 8.7 8.7 8.0*   Liver Function Tests: No results found for this basename: AST, ALT, ALKPHOS, BILITOT, PROT, ALBUMIN,  in the last 168 hours  CBC:  Recent Labs Lab 07/22/13 1550 07/23/13 0308 07/24/13 0325  WBC 16.3* 27.2* 24.0*  NEUTROABS 13.0*  --   --   HGB 10.4* 8.1* 7.8*  HCT 30.5* 25.0* 22.8*  MCV 93.3 94.7 89.8  PLT 302 216 175   Cardiac Enzymes:  Recent Labs Lab 07/22/13 0005  TROPONINI <0.30    Recent Results (from the past 240 hour(s))  SURGICAL PCR SCREEN     Status: None   Collection Time    07/22/13  8:22 PM      Result Value Range Status   MRSA, PCR NEGATIVE  NEGATIVE Final   Staphylococcus aureus NEGATIVE  NEGATIVE Final   Comment:            The Xpert SA Assay (FDA     approved for NASAL specimens     in patients over 61 years of age),     is one component of     a comprehensive surveillance     program.  Test performance has     been validated by [employer] for patients greater     than or equal to 69 year old.     It is not intended     to diagnose infection nor to     guide or monitor treatment.  MRSA PCR SCREENING     Status: None   Collection Time    07/23/13  5:08 AM      Result Value Range Status   MRSA by PCR NEGATIVE  NEGATIVE Final   Comment:            The  GeneXpert MRSA Assay (FDA     approved for NASAL specimens     only), is one component of a     comprehensive MRSA colonization     surveillance program. It is not     intended to diagnose MRSA     infection nor to guide or     monitor treatment for     MRSA infections.     Studies:  Recent x-ray studies have been reviewed in detail by the Attending Physician  Time spent :     [name], [facility] [facility] Office  [phone] Pager [phone]  **If unable to reach the above provider after paging please contact the [place] @ [phone]  On-Call/Text Page:      [email or web address]      password [facility]  If 7PM-7AM, please contact night-coverage [email or web address] Password [email or web address] 07/24/2013, 10:50 AM   LOS: 2 days    I have examined the patient, reviewed the chart and modified the above note which I agree with.   Argil Mahl,MD [phone] 07/24/2013, 4:37 PM

## 2013-07-24 NOTE — Clinical Social Work Psychosocial (Signed)
Clinical Social Work Department BRIEF PSYCHOSOCIAL ASSESSMENT 07/24/2013  Patient:  JORDANN, GRIME     Account Number:  [id number]     Admit date:  07/22/2013  Clinical Social Worker:  Lovey Newcomer  Date/Time:  07/24/2013 02:29 PM  Referred by:  Physician  Date Referred:  07/24/2013 Referred for  SNF Placement   Other Referral:   Interview type:  Family Other interview type:   Daughter interviewed at bedside with patient present.    PSYCHOSOCIAL DATA Living Status:  FACILITY Admitted from facility:  [place] Level of care:  [place] Primary support name:  Madaline Guthrie Primary support relationship to patient:  CHILD, ADULT Degree of support available:   Support is adequate.    CURRENT CONCERNS Current Concerns  Post-Acute Placement   Other Concerns:    SOCIAL WORK ASSESSMENT / PLAN CSW met with patient and daughter at bedside to confirm that patient is from [place]. Patient has lived at [facility] for 11 years. At first she lived in their independent living but transitioned to SNF level in September of 2014. Daughter plans for patient to return. Daughter states that she is very happy with the care her mother receives at [facility]. CSW will assist with DC.   Assessment/plan status:  Psychosocial Support/Ongoing Assessment of Needs Other assessment/ plan:   Complete FL2, Fax   Information/referral to community resources:   CSW contact information given to daughter. Daughter was pleasant, appropriate, and appreciative of CSW contact. CSW will assist with DC.    PATIENT'S/FAMILY'S RESPONSE TO PLAN OF CARE:      Ermalene Searing, [phone]

## 2013-07-24 NOTE — Progress Notes (Signed)
OT Cancellation Note  Patient Details Name: Nancy Ponce MRN: [id number] DOB: Oct 12, 1920   Cancelled Treatment:    Reason Eval/Treat Not Completed: Other (comment). Pt +2 total A with PT yesterday, little initiation of movement, and could not stand with PT. Pt not ready for acute OT at this time and OT eval may not be needed for pt to go to SNF level of care. Will follow to see if OT eval is needed--if so please call [phone] or page [phone].  Almon Register [phone] 07/24/2013, 8:12 AM

## 2013-07-25 ENCOUNTER — Inpatient Hospital Stay (HOSPITAL_COMMUNITY): Payer: Medicare Other

## 2013-07-25 LAB — CBC
HCT: 27.7 % — ABNORMAL LOW (ref 36.0–46.0)
HEMOGLOBIN: 9.4 g/dL — AB (ref 12.0–15.0)
MCH: 30.3 pg (ref 26.0–34.0)
MCHC: 33.9 g/dL (ref 30.0–36.0)
MCV: 89.4 fL (ref 78.0–100.0)
Platelets: 155 10*3/uL (ref 150–400)
RBC: 3.1 MIL/uL — AB (ref 3.87–5.11)
RDW: 16.1 % — ABNORMAL HIGH (ref 11.5–15.5)
WBC: 30.7 10*3/uL — AB (ref 4.0–10.5)

## 2013-07-25 LAB — TYPE AND SCREEN
ABO/RH(D): A POS
Antibody Screen: NEGATIVE
UNIT DIVISION: 0
Unit division: 0

## 2013-07-25 LAB — URINALYSIS W MICROSCOPIC + REFLEX CULTURE
Bilirubin Urine: NEGATIVE
GLUCOSE, UA: NEGATIVE mg/dL
Ketones, ur: NEGATIVE mg/dL
LEUKOCYTES UA: NEGATIVE
Nitrite: NEGATIVE
PH: 5 (ref 5.0–8.0)
PROTEIN: 30 mg/dL — AB
Specific Gravity, Urine: 1.023 (ref 1.005–1.030)
Urobilinogen, UA: 0.2 mg/dL (ref 0.0–1.0)

## 2013-07-25 LAB — BASIC METABOLIC PANEL
BUN: 48 mg/dL — ABNORMAL HIGH (ref 6–23)
CHLORIDE: 109 meq/L (ref 96–112)
CO2: 24 meq/L (ref 19–32)
Calcium: 8 mg/dL — ABNORMAL LOW (ref 8.4–10.5)
Creatinine, Ser: 1.44 mg/dL — ABNORMAL HIGH (ref 0.50–1.10)
GFR calc Af Amer: 36 mL/min — ABNORMAL LOW (ref >90)
GFR calc non Af Amer: 31 mL/min — ABNORMAL LOW (ref >90)
Glucose, Bld: 112 mg/dL — ABNORMAL HIGH (ref 70–99)
Potassium: 4.9 mEq/L (ref 3.7–5.3)
SODIUM: 143 meq/L (ref 137–147)

## 2013-07-25 MED ORDER — LEVALBUTEROL HCL 0.63 MG/3ML IN NEBU
0.6300 mg | INHALATION_SOLUTION | RESPIRATORY_TRACT | Status: DC | PRN
  Filled 2013-07-25: qty 3

## 2013-07-25 MED ORDER — METOPROLOL TARTRATE 25 MG/10 ML ORAL SUSPENSION
12.5000 mg | Freq: Two times a day (BID) | ORAL | Status: DC
  Administered 2013-07-25 – 2013-07-27 (×4): 12.5 mg via ORAL
  Filled 2013-07-25 (×5): qty 5

## 2013-07-25 MED ORDER — METOPROLOL SUCCINATE ER 25 MG PO TB24
25.0000 mg | ORAL_TABLET | Freq: Every day | ORAL | Status: DC
Start: 2013-07-25 — End: 2013-07-25
  Filled 2013-07-25 (×2): qty 1

## 2013-07-25 MED ORDER — ALPRAZOLAM 0.25 MG PO TABS: 0.1250 mg | ORAL_TABLET | Freq: Three times a day (TID) | ORAL | Status: DC | PRN

## 2013-07-25 MED ORDER — MEGESTROL ACETATE 400 MG/10ML PO SUSP
200.0000 mg | Freq: Every day | ORAL | Status: DC
  Administered 2013-07-25 – 2013-07-27 (×3): 200 mg via ORAL
  Filled 2013-07-25 (×3): qty 5

## 2013-07-25 MED ORDER — ASPIRIN 81 MG PO CHEW
81.0000 mg | CHEWABLE_TABLET | Freq: Every day | ORAL | Status: DC
  Administered 2013-07-25 – 2013-07-27 (×3): 81 mg via ORAL
  Filled 2013-07-25 (×3): qty 1

## 2013-07-25 MED ORDER — ASPIRIN 81 MG PO TABS: 81.0000 mg | ORAL_TABLET | Freq: Every day | ORAL | Status: DC

## 2013-07-25 NOTE — Progress Notes (Signed)
     Subjective:  Patient reports pain as mild.  No change in interaction.  Still minimally verbal.  Objective:   VITALS:   Filed Vitals:   07/25/13 0400 07/25/13 0721 07/25/13 0800 07/25/13 1200  BP:  156/72    Pulse:  85    Temp: 97.8 F (36.6 C) 98 F (36.7 C)  97.6 F (36.4 C)  TempSrc: Oral Oral  Oral  Resp:  16 16   Height:      Weight:      SpO2:  95% 95%     Neurologically intact Dorsiflexion/Plantar flexion intact Incision: scant drainage Dressing changed over the only wound with any drainage.  No cellulitis.  Lab Results  Component Value Date   WBC 30.7* 07/25/2013   HGB 9.4* 07/25/2013   HCT 27.7* 07/25/2013   MCV 89.4 07/25/2013   PLT 155 07/25/2013     Assessment/Plan: 3 Days Post-Op   Active Problems:   BRONCHIECTASIS   Essential and other specified forms of tremor   Closed intertrochanteric fracture of left hip   Leukocytosis   Thoracic compression fracture   Dehydration   Hypotension   FTT (failure to thrive) in adult   CKD (chronic kidney disease), stage III   AKI (acute kidney injury)   Hyperkalemia   Postoperative anemia due to acute blood loss   Advance diet Up with therapy Discharge to SNF Elevated WBC per primary team.  Anemia and renal function improved with blood.   Zaleah Ternes P 07/25/2013, 1:41 PM   Marchia Bond, MD Cell [phone]

## 2013-07-25 NOTE — Progress Notes (Signed)
[facility]  BERIT RACZKOWSKI WJX:[id number] DOB: May 06, 1921 DOA: 07/22/2013 PCP: Mathews Argyle, MD  Brief narrative: 80 year old female patient resident of assisted-living facility primarily wheelchair-bound. Apparently was in her wheelchair when she lunged forward and fell. Was noted to have deformity of left leg. She is able to ambulate with a walker with direct supervision and must be assisted to get up to the standing position.  Upon evaluation in ER she was found to have mild leukocytosis and x-ray confirmed hip fracture. Orthopedic service was consulted.  Assessment/Plan:    Closed intertrochanteric fracture of left hip -Stable postop -Per Orthopedic team from a postoperative standpoint ready for discharge     Dehydration -Suspect was somewhat dehydrated prior to her fall and stress of surgery further exacerbated -Continued IV fluid but since did receive blood decreased rate from 125 to 75 to 50 then to KVO-unfortunately UOP dropped off and BUN/Cr increased on 1/27 so IVFs increased back to 75 -Today BP up so will once again KVO fluids and follow     Postoperative anemia due to acute blood loss  -Combination of blood loss from surgery as well as hemodilution after rehydration -Hgb 7.8 despite 2 units PRBCs 1/26- suspect dilutional component and may have been quite low pre admit and was masked by severe DH   HTN -dc Midodrine since BP now up -resume home Toprol XL    Leukocytosis - UA negative  -Likely reactive in nature/appropriate stress demargination related to recent trauma and stress of surgery as well as dehydration hypoperfusion from hypotension -Trending up without fever- since up to >30,000 will repeat UA with a cx and check PCXR and blood cx's -no distant baseline available     Hypotension -resolved -on chronic Midodrine- also was on Toprol XL- have dc'd Midodrine (see above)    CKD stage III / AKI (acute kidney  injury) -hypoperfusion/hypotension related - finally improving - no offending meds and did not receive IV contrast    Hyperkalemia -Secondary to acute kidney injury and dehydration -Follow labs    Thoracic compression fracture    FTT (failure to thrive) in adult -According to patient's daughter has had progressive functional decline over the past year most notably since Thanksgiving of 2014 -Currently is DO NOT RESUSCITATE    BRONCHIECTASIS / mild hypoxemic respiratory failure -Based on chest x-ray appears to be chronic and primarily on right side could be sign of chronic aspiration -SLP evaluated for appropriate diet consistency but would not pursue MBSS at this time -Continue supportive care with oxygen    Essential tremor   DVT prophylaxis: Lovenox Code Status: DO NOT RESUSCITATE Family Communication: Daughter at bedside Disposition Plan/Expected LOS: Transfer to floor - d/c to SNF when WBC count improved or clear etiology discerned   Consultants: Orthopedics  Procedures: Left intramedullary nail/intertrochanteric for left fracture  Dr. Mardelle Matte  Antibiotics: Ancef 1/25 >>> 1/26  HPI/Subjective: Patient alert - dtr at bedside assisting pt with oral care - pt appears to get anxious very easily.  Objective: Blood pressure 156/72, pulse 85, temperature 97.6 F (36.4 C), temperature source Oral, resp. rate 16, height 5\' 6"  (1.676 m), weight 94 lb 9.2 oz (42.9 kg), SpO2 95.00%.  Intake/Output Summary (Last 24 hours) at 07/25/13 1237 Last data filed at 07/25/13 1000  Gross per 24 hour  Intake   1160 ml  Output    510 ml  Net    650 ml   Exam: General: No  acute respiratory distress at rest  Lungs: Coarse to auscultation bilaterally without wheezes or crackles, decreased bases, RA L - upper airway congestion  Cardiovascular: Regular rate and rhythm without murmur gallop or rub normal S1 and S2, no peripheral edema or JVD Abdomen: Nontender, nondistended, soft, bowel  sounds positive, no rebound, no ascites, no appreciable mass Musculoskeletal: No significant cyanosis, clubbing of bilateral lower extremities Neurological: Awake and oriented x name only, moves all extremities x 4 without focal neurological deficits of the left leg slightly weaker due to recent hip fracture repair and expected postoperative pain  Scheduled Meds:  Scheduled Meds: . enoxaparin (LOVENOX) injection  30 mg Subcutaneous Q24H  . feeding supplement (RESOURCE BREEZE)  1 Container Oral BID BM  . mirtazapine  15 mg Oral QHS  . senna-docusate  1 tablet Oral BID  . sertraline  50 mg Oral Daily   Data Reviewed: Basic Metabolic Panel:  Recent Labs Lab 07/22/13 1550 07/23/13 0308 07/24/13 0325 07/25/13 0315  NA 138 137 136* 143  K 5.2 5.6* 5.3 4.9  CL 99 97 100 109  CO2 27 25 25 24   GLUCOSE 122* 167* 103* 112*  BUN 32* 35* 50* 48*  CREATININE 0.96 1.40*  1.39* 2.15* 1.44*  CALCIUM 8.7 8.7 8.0* 8.0*   Liver Function Tests: No results found for this basename: AST, ALT, ALKPHOS, BILITOT, PROT, ALBUMIN,  in the last 168 hours  CBC:  Recent Labs Lab 07/22/13 1550 07/23/13 0308 07/24/13 0325 07/25/13 0315  WBC 16.3* 27.2* 24.0* 30.7*  NEUTROABS 13.0*  --   --   --   HGB 10.4* 8.1* 7.8* 9.4*  HCT 30.5* 25.0* 22.8* 27.7*  MCV 93.3 94.7 89.8 89.4  PLT 302 216 175 155   Cardiac Enzymes:  Recent Labs Lab 07/22/13 0005  TROPONINI <0.30    Recent Results (from the past 240 hour(s))  SURGICAL PCR SCREEN     Status: None   Collection Time    07/22/13  8:22 PM      Result Value Range Status   MRSA, PCR NEGATIVE  NEGATIVE Final   Staphylococcus aureus NEGATIVE  NEGATIVE Final   Comment:            The Xpert SA Assay (FDA     approved for NASAL specimens     in patients over 50 years of age),     is one component of     a comprehensive surveillance     program.  Test performance has     been validated by [employer] for patients greater     than or equal  to 95 year old.     It is not intended     to diagnose infection nor to     guide or monitor treatment.  MRSA PCR SCREENING     Status: None   Collection Time    07/23/13  5:08 AM      Result Value Range Status   MRSA by PCR NEGATIVE  NEGATIVE Final   Comment:            The GeneXpert MRSA Assay (FDA     approved for NASAL specimens     only), is one component of a     comprehensive MRSA colonization     surveillance program. It is not     intended to diagnose MRSA     infection nor to guide or     monitor treatment for  MRSA infections.  URINE CULTURE     Status: None   Collection Time    07/23/13 12:06 PM      Result Value Range Status   Specimen Description URINE, CATHETERIZED   Final   Special Requests NONE   Final   Culture  Setup Time     Final   Value: 07/23/2013 17:19     Performed at [place]     Final   Value: NO GROWTH     Performed at [facility]   Culture     Final   Value: NO GROWTH     Performed at [facility]   Report Status 07/24/2013 FINAL   Final     Studies:  Recent x-ray studies have been reviewed in detail by the Attending Physician  Time spent : 98mins     Allison Ellis, ANP [facility] Office  [phone] Pager [phone]  **If unable to reach the above provider after paging please contact the [place] @ [phone]  On-Call/Text Page:      [email or web address]      password [email or web address]  If 7PM-7AM, please contact night-coverage [email or web address] Password [email or web address] 07/25/2013, 12:37 PM   LOS: 3 days   I have personally examined this patient and reviewed the entire database. I have reviewed the above note, made any necessary editorial changes, and agree with its content.  Cherene Altes, MD [facility]

## 2013-07-25 NOTE — Evaluation (Signed)
Occupational Therapy Evaluation Patient Details Name: Nancy Ponce MRN: [id number] DOB: 1920/08/04 Today's Date: 07/25/2013 Time: 6644-0347 OT Time Calculation (min): 18 min  OT Assessment / Plan / Recommendation History of present illness She is from [employer] (was in [facility] but now with more assistance)  She has a history of osteoporosis, dementia, bronchiectasis.   Today she was getting up out of her wheelchair to lunge at another patient when she fell. She has a deformity of her left leg.  Since Thanksgiving she has been falling more frequently and has had to get around in a wheelchair.  she does participate in PT at the SNF 2 days a week and can ambulate with a walker with supervision   Clinical Impression   Pt is s/p IM Nail.  She has a h/o dementia but participated more in grooming/self feeding than she does now, and also assisted with toilet transfers. Recommend SNF for rehab and recommend OT to work towards [place].     OT Assessment  All further OT needs can be met in the next venue of care    Follow Up Recommendations  SNF    Barriers to Discharge      Equipment Recommendations  None recommended by OT    Recommendations for Other Services    Frequency       Precautions / Restrictions Precautions Precautions: Fall Restrictions LLE Weight Bearing: Weight bearing as tolerated   Pertinent Vitals/Pain No c/o, signs or symptoms of pain    ADL  Eating/Feeding: +1 Total assistance (pt 10% with sippy cup) Where Assessed - Eating/Feeding: Bed level Grooming: Wash/dry face;+1 Total assistance (pt 5%) Where Assessed - Grooming: Supine, head of bed up Transfers/Ambulation Related to ADLs: did not move pt:  per PT notes:  she needs total A x 2 for all mobility ADL Comments: Pt has intention tremor but performed grooming and self-feeding prior to admission.  She has "sippy-type cup".  Pt did initiate beverage but needed assistance to reach mouth and get to the correct angle. Pt  chews a lot and eats very slowly. She also assisted a little with washing face.  Initially provided Hand over hand assist to initiate.  Pt sleepy at time of eval    OT Diagnosis: Generalized weakness  OT Problem List: Decreased strength;Decreased activity tolerance;Impaired balance (sitting and/or standing);Decreased cognition;Decreased coordination;Decreased knowledge of use of DME or AE;Pain;Impaired UE functional use OT Treatment Interventions:     OT Goals(Current goals can be found in the care plan section)    Visit Information  Last OT Received On: 07/25/13 Assistance Needed: +2 History of Present Illness: She is from [employer] (was in [place] but now with more assistance)  She has a history of osteoporosis, dementia, bronchiectasis.   Today she was getting up out of her wheelchair to lunge at another patient when she fell. She has a deformity of her left leg.  Since Thanksgiving she has been falling more frequently and has had to get around in a wheelchair.  she does participate in PT at the SNF 2 days a week and can ambulate with a walker with supervision       Prior [place] expects to be discharged to:: Skilled nursing facility Prior Function Level of Independence: Needs assistance Comments: pt performed grooming and self-fed with extra time and set up.  Has intension tremor.  Assist for all other adls and toileting Communication Communication:  (nods) Pt is HOH and hearing  aids are not present        Vision/Perception     Cognition  Cognition Arousal/Alertness: Awake/alert Behavior During Therapy: Flat affect Overall Cognitive Status: History of cognitive impairments - at baseline    Extremity/Trunk Assessment Upper Extremity Assessment Upper Extremity Assessment: Generalized weakness (pt does initiate movement with bil UEs; intention tremor)     Mobility       Exercise     Balance     End of Session OT - End of  Session Activity Tolerance: Patient limited by fatigue Patient left: in bed;with call bell/phone within reach;with family/visitor present  [place] 07/25/2013, 1:44 PM Lesle Chris, OTR/L [phone] 07/25/2013

## 2013-07-25 NOTE — Progress Notes (Signed)
Foley Catheter removed per removal catheter.

## 2013-07-25 NOTE — Progress Notes (Signed)
Physical Therapy Treatment Patient Details Name: Nancy Ponce MRN: [id number] DOB: Nov 23, 1920 Today's Date: 07/25/2013 Time: 5621-3086 PT Time Calculation (min): 28 min  PT Assessment / Plan / Recommendation  History of Present Illness She is from [employer] (was in IL[place] but now with more assistance)  She has a history of osteoporosis, dementia, bronchiectasis.   Today she was getting up out of her wheelchair to lunge at another patient when she fell. She has a deformity of her left leg.  Since Thanksgiving she has been falling more frequently and has had to get around in a wheelchair.  she does participate in PT at the SNF 2 days a week and can ambulate with a walker with supervision   PT Comments   Pt tolerated sitting EOB with bilat knees in flexion this date however remains to requiring totalAx2 for all transfers. Pt currently unable to ambulate but was able to tolerate std pvt to chair this date. Educated Conservation officer, historic buildings on how to properly/safely transfer pt back to bed using 2 person assist and transferring to R side. RN and Engineer, manufacturing with verbal understanding. Pt remains appropriate for SNF upon d/c.  Follow Up Recommendations  SNF     Does the patient have the potential to tolerate intense rehabilitation     Barriers to Discharge        Equipment Recommendations  None recommended by PT    Recommendations for Other Services    Frequency Min 3X/week   Progress towards PT Goals    Plan Current plan remains appropriate    Precautions / Restrictions Precautions Precautions: Fall Restrictions Weight Bearing Restrictions: Yes LLE Weight Bearing: Weight bearing as tolerated   Pertinent Vitals/Pain grimices with L hip ROM due to pain    Mobility  Bed Mobility Overal bed mobility: Needs Assistance;+2 for physical assistance Bed Mobility: Supine to Sit Supine to sit: Total assist;+2 for physical assistance General bed mobility comments: assist for trunk elevation and LE management, pt  with no initiation of transfer Transfers Overall transfer level: Needs assistance Transfers: Stand Pivot Transfers;Sit to/from Stand Sit to Stand: Total assist;+2 physical assistance Stand pivot transfers: Total assist;+2 physical assistance General transfer comment: pt did initiate sit to stand by reaching for walker but dependent to achieve standing. pt did attempted to take shuffled steps to chair. suspect <50% WBing thru L LE. Ambulation/Gait Ambulation/Gait assistance:  (unable at this time)    Exercises Other Exercises Other Exercises: PROM to L LE, knee WFL hip flexion approx 30 deg prior to grimacing in pain Other Exercises: bilat LE LAQ in sitting x 5 reps   PT Diagnosis:    PT Problem List:   PT Treatment Interventions:     PT Goals (current goals can now be found in the care plan section)    Visit Information  Last PT Received On: 07/25/13 Assistance Needed: +2 History of Present Illness: She is from [employer] (was in [place] but now with more assistance)  She has a history of osteoporosis, dementia, bronchiectasis.   Today she was getting up out of her wheelchair to lunge at another patient when she fell. She has a deformity of her left leg.  Since Thanksgiving she has been falling more frequently and has had to get around in a wheelchair.  she does participate in PT at the SNF 2 days a week and can ambulate with a walker with supervision    Subjective Data      Cognition  Cognition Arousal/Alertness:  Awake/alert Behavior During Therapy: Flat affect (anxious once started moving) Overall Cognitive Status: History of cognitive impairments - at baseline    Balance  Balance Overall balance assessment: Needs assistance Sitting-balance support: Feet supported;Bilateral upper extremity supported Sitting balance-Leahy Scale: Zero Sitting balance - Comments: sat EOB x 5 min, strong posterior lean. did complete 5 reps with max v/c's of bilat LE LAQ, pt unable to comprehend LE  ther ex at this time.  Standing balance support: Bilateral upper extremity supported Standing balance-Leahy Scale: Zero Standing balance comment: unable to stand upright in walker  End of Session PT - End of Session Equipment Utilized During Treatment: Gait belt Activity Tolerance: Patient limited by pain Patient left: in chair;with call bell/phone within reach Nurse Communication: Mobility status (turn chair to transfer pt back to bed so transferring to R)   GP     Kingsley Callander 07/25/2013, 9:13 AM  Kittie Plater, PT, DPT Pager #: [phone] Office #: [phone]

## 2013-07-25 NOTE — Progress Notes (Signed)
NUTRITION FOLLOW-UP  DOCUMENTATION CODES Per approved criteria  -Severe malnutrition in the context of chronic illness -Underweight   INTERVENTION: Diet texture and liquid consistency per SLP. Continue Resource Breeze po BID, each supplement provides 250 kcal and 9 grams of protein. Please thicken to appropriate consistency. Continue Magic cup TID with meals, each supplement provides 290 kcal and 9 grams of protein. RD provided daughter with information on where to purchase. RD to continue to follow nutrition care plan.  NUTRITION DIAGNOSIS: Increased nutrient needs related to post-op healing as evidenced by estimated needs. Ongoing.  Goal: Intake to meet at least 90% of estimated needs. Unmet.  Monitor:  weight trends, lab trends, I/O's  ASSESSMENT: PMHx significant for osteoporosis, dementia and bronchiectasis. Admitted s/p fall from wheelchair at [facility]. Work-up reveals complex unstable left intertrochanteric hip fracture.  Pt evaluated by ortho on 1/25 and pt was found to be at high risk for surgery, however daughter was very clear that she wanted pt to pursue surgery. Underwent IM nail 1/25. Pt noted to have post-op anemia and received 2 units of PRBCs on 1/26.  SLP evaluated, and put pt on Dysphagia 3 diet with Nectar Thickened Liquids. Per daughter, pt is barely eating anything other than Magic Cups off of her meal trays. She will drink thickened [employer]. Daughter with questions about where to purchase Magic Cups when d/c - RD provided her with info.  Pt meets criteria for severe MALNUTRITION in the context of chronic illness as evidenced by severe fat and muscle mass loss, intake of <75% x at least 1 month.   Height: Ht Readings from Last 1 Encounters:  07/23/13 5\' 6"  (1.676 m)    Weight: Wt Readings from Last 1 Encounters:  07/23/13 94 lb 9.2 oz (42.9 kg)   BMI:  Body mass index is 15.27 kg/(m^2). Underweight  Estimated Nutritional Needs: Kcal: 1500 -  1650 Protein: 55 - 65 g Fluid: 1.5 - 1.7 liters  Skin:  L hip incision L thigh incision L knee incision  Diet Order: Dysphagia 3; Nectar   EDUCATION NEEDS: -No education needs identified at this time   Intake/Output Summary (Last 24 hours) at 07/25/13 1318 Last data filed at 07/25/13 1000  Gross per 24 hour  Intake   1060 ml  Output    510 ml  Net    550 ml    Last BM: 1/27  Labs:   Recent Labs Lab 07/23/13 0308 07/24/13 0325 07/25/13 0315  NA 137 136* 143  K 5.6* 5.3 4.9  CL 97 100 109  CO2 25 25 24   BUN 35* 50* 48*  CREATININE 1.40*  1.39* 2.15* 1.44*  CALCIUM 8.7 8.0* 8.0*  GLUCOSE 167* 103* 112*    CBG (last 3)  No results found for this basename: GLUCAP,  in the last 72 hours  Scheduled Meds: . aspirin  81 mg Oral Daily  . enoxaparin (LOVENOX) injection  30 mg Subcutaneous Q24H  . feeding supplement (RESOURCE BREEZE)  1 Container Oral BID BM  . megestrol  200 mg Oral Daily  . metoprolol succinate  25 mg Oral QHS  . mirtazapine  15 mg Oral QHS  . senna-docusate  1 tablet Oral BID  . sertraline  50 mg Oral Daily    Continuous Infusions: . sodium chloride 10 mL/hr at 07/25/13 [place], [place], [place] Pager: [phone] After-hours pager: [phone]

## 2013-07-26 DIAGNOSIS — E860 Dehydration: Secondary | ICD-10-CM

## 2013-07-26 LAB — CBC
HCT: 26.7 % — ABNORMAL LOW (ref 36.0–46.0)
Hemoglobin: 8.9 g/dL — ABNORMAL LOW (ref 12.0–15.0)
MCH: 30.6 pg (ref 26.0–34.0)
MCHC: 33.3 g/dL (ref 30.0–36.0)
MCV: 91.8 fL (ref 78.0–100.0)
Platelets: 176 10*3/uL (ref 150–400)
RBC: 2.91 MIL/uL — ABNORMAL LOW (ref 3.87–5.11)
RDW: 16.1 % — AB (ref 11.5–15.5)
WBC: 22.9 10*3/uL — ABNORMAL HIGH (ref 4.0–10.5)

## 2013-07-26 LAB — BASIC METABOLIC PANEL
BUN: 53 mg/dL — AB (ref 6–23)
CALCIUM: 8.3 mg/dL — AB (ref 8.4–10.5)
CO2: 26 meq/L (ref 19–32)
CREATININE: 1.29 mg/dL — AB (ref 0.50–1.10)
Chloride: 107 mEq/L (ref 96–112)
GFR calc Af Amer: 41 mL/min — ABNORMAL LOW (ref >90)
GFR calc non Af Amer: 35 mL/min — ABNORMAL LOW (ref >90)
GLUCOSE: 152 mg/dL — AB (ref 70–99)
Potassium: 4.6 mEq/L (ref 3.7–5.3)
Sodium: 143 mEq/L (ref 137–147)

## 2013-07-26 LAB — CK: Total CK: 34 U/L (ref 7–177)

## 2013-07-26 NOTE — Progress Notes (Signed)
Utilization review completed.  

## 2013-07-26 NOTE — Progress Notes (Signed)
[facility]  BRANDYN THIEN ZOX:[id number] DOB: 1921-06-04 DOA: 07/22/2013 PCP: Mathews Argyle, MD  Brief narrative: 80 year old female patient resident of assisted-living facility primarily wheelchair-bound. Apparently was in her wheelchair when she lunged forward and fell. Was noted to have deformity of left leg. She is able to ambulate with a walker with direct supervision and must be assisted to get up to the standing position.  Upon evaluation in ER she was found to have mild leukocytosis and x-ray confirmed hip fracture. Orthopedic service was consulted.  Assessment/Plan:    Closed intertrochanteric fracture of left hip -Stable postop -Per Orthopedic team from a postoperative standpoint ready for discharge     Dehydration -Suspect was somewhat dehydrated prior to her fall and stress of surgery further exacerbated -Continued IV fluid but since did receive blood decreased rate from 125 to 75 to 50 then to KVO-unfortunately UOP dropped off and BUN/Cr increased on 1/27 so IVFs increased back to 75 -BP up 1/28 so KVO'd fluids -repeat UA still c/w low volume- per daughter when she is with pt until 7p pt drinks total of 3 sippy cups full (each about 240-300 cc)    Postoperative anemia due to acute blood loss  -Combination of blood loss from surgery as well as hemodilution after rehydration -post 2 units PRBCs 1/26 - suspect dilutional component and may have been quite low pre admit and was masked by severe DH -subtle trend down but >7.0 and suspect finally equalizing as inches towards euvolemia   HTN -dc Midodrine since BP now up -resume home Toprol XL    Leukocytosis - UA negative  -Likely reactive in nature/appropriate stress demargination related to recent trauma and stress of surgery as well as dehydration hypoperfusion from hypotension -Trended up without fever- since up to >30,000 - repeat UA and PCXR not c/w infectious etiology -  blood cx's NGTD -current trend is finally down -no distant baseline available     Hypotension -resolved -on chronic Midodrine- also was on Toprol XL- have dc'd Midodrine (see above)    CKD stage III / AKI (acute kidney injury) -hypoperfusion/hypotension related - finally improving - no offending meds and did not receive IV contrast-UA seems c/w mild ATN- CPK though was only 34 today    Hyperkalemia -Secondary to acute kidney injury and dehydration -Follow labs-trending down    Thoracic compression fracture    FTT (failure to thrive) in adult -According to patient's daughter has had progressive functional decline over the past year most notably since Thanksgiving of 2014 -Currently is DO NOT RESUSCITATE    BRONCHIECTASIS / mild hypoxemic respiratory failure -Based on chest x-ray appears to be chronic and primarily on right side could be sign of chronic aspiration -SLP evaluated for appropriate diet consistency but would not pursue MBSS at this time -Continue supportive care with oxygen    Essential tremor   DVT prophylaxis: Lovenox Code Status: DO NOT RESUSCITATE Family Communication: Daughter at bedside Disposition Plan/Expected LOS: Transfer to floor - d/c to SNF when WBC count improved or clear etiology discerned   Consultants: Orthopedics  Procedures: Left intramedullary nail/intertrochanteric for left fracture  Dr. Mardelle Matte  Antibiotics: Ancef 1/25 >>> 1/26  HPI/Subjective: Patient alert - dtr at bedside. Pt limited verbal response but no apparent c/o's-pt does make limited eye contact  Objective: Blood pressure 124/66, pulse 82, temperature 97.8 F (36.6 C), temperature source Oral, resp. rate 23, height 5\' 6"  (1.676 m), weight 94 lb 9.2  oz (42.9 kg), SpO2 95.00%.  Intake/Output Summary (Last 24 hours) at 07/26/13 1357 Last data filed at 07/26/13 1200  Gross per 24 hour  Intake    780 ml  Output    150 ml  Net    630 ml   Exam: General: No acute  respiratory distress at rest  Lungs: Coarse to auscultation bilaterally without wheezes or crackles, decreased bases, RA L - upper airway congestion  Cardiovascular: Regular rate and rhythm without murmur gallop or rub normal S1 and S2, no peripheral edema or JVD Abdomen: Nontender, nondistended, soft, bowel sounds positive, no rebound, no ascites, no appreciable mass Musculoskeletal: No significant cyanosis, clubbing of bilateral lower extremities Neurological: Awake and oriented x name only, moves all extremities x 4 without focal neurological deficits of the left leg slightly weaker due to recent hip fracture repair and expected postoperative pain  Scheduled Meds:  Scheduled Meds: . aspirin  81 mg Oral Daily  . enoxaparin (LOVENOX) injection  30 mg Subcutaneous Q24H  . feeding supplement (RESOURCE BREEZE)  1 Container Oral BID BM  . megestrol  200 mg Oral Daily  . metoprolol tartrate  12.5 mg Oral BID  . mirtazapine  15 mg Oral QHS  . senna-docusate  1 tablet Oral BID  . sertraline  50 mg Oral Daily   Data Reviewed: Basic Metabolic Panel:  Recent Labs Lab 07/22/13 1550 07/23/13 0308 07/24/13 0325 07/25/13 0315 07/26/13 0252  NA 138 137 136* 143 143  K 5.2 5.6* 5.3 4.9 4.6  CL 99 97 100 109 107  CO2 27 25 25 24 26   GLUCOSE 122* 167* 103* 112* 152*  BUN 32* 35* 50* 48* 53*  CREATININE 0.96 1.40*  1.39* 2.15* 1.44* 1.29*  CALCIUM 8.7 8.7 8.0* 8.0* 8.3*   Liver Function Tests: No results found for this basename: AST, ALT, ALKPHOS, BILITOT, PROT, ALBUMIN,  in the last 168 hours  CBC:  Recent Labs Lab 07/22/13 1550 07/23/13 0308 07/24/13 0325 07/25/13 0315 07/26/13 0252  WBC 16.3* 27.2* 24.0* 30.7* 22.9*  NEUTROABS 13.0*  --   --   --   --   HGB 10.4* 8.1* 7.8* 9.4* 8.9*  HCT 30.5* 25.0* 22.8* 27.7* 26.7*  MCV 93.3 94.7 89.8 89.4 91.8  PLT 302 216 175 155 176   Cardiac Enzymes:  Recent Labs Lab 07/22/13 0005 07/26/13 1122  CKTOTAL  --  30  TROPONINI <0.30   --     Recent Results (from the past 240 hour(s))  SURGICAL PCR SCREEN     Status: None   Collection Time    07/22/13  8:22 PM      Result Value Range Status   MRSA, PCR NEGATIVE  NEGATIVE Final   Staphylococcus aureus NEGATIVE  NEGATIVE Final   Comment:            The Xpert SA Assay (FDA     approved for NASAL specimens     in patients over 20 years of age),     is one component of     a comprehensive surveillance     program.  Test performance has     been validated by [employer] for patients greater     than or equal to 35 year old.     It is not intended     to diagnose infection nor to     guide or monitor treatment.  MRSA PCR SCREENING     Status: None  Collection Time    07/23/13  5:08 AM      Result Value Range Status   MRSA by PCR NEGATIVE  NEGATIVE Final   Comment:            The GeneXpert MRSA Assay (FDA     approved for NASAL specimens     only), is one component of a     comprehensive MRSA colonization     surveillance program. It is not     intended to diagnose MRSA     infection nor to guide or     monitor treatment for     MRSA infections.  URINE CULTURE     Status: None   Collection Time    07/23/13 12:06 PM      Result Value Range Status   Specimen Description URINE, CATHETERIZED   Final   Special Requests NONE   Final   Culture  Setup Time     Final   Value: 07/23/2013 17:19     Performed at [place]     Final   Value: NO GROWTH     Performed at [facility]   Culture     Final   Value: NO GROWTH     Performed at [facility]   Report Status 07/24/2013 FINAL   Final  CULTURE, BLOOD (ROUTINE X 2)     Status: None   Collection Time    07/25/13  8:35 AM      Result Value Range Status   Specimen Description BLOOD LEFT HAND   Final   Special Requests BOTTLES DRAWN AEROBIC ONLY 3CC   Final   Culture  Setup Time     Final   Value: 07/25/2013 13:17     Performed at [facility]   Culture      Final   Value:        BLOOD CULTURE RECEIVED NO GROWTH TO DATE CULTURE WILL BE HELD FOR 5 DAYS BEFORE ISSUING A FINAL NEGATIVE REPORT     Performed at [facility]   Report Status PENDING   Incomplete  CULTURE, BLOOD (ROUTINE X 2)     Status: None   Collection Time    07/25/13  8:40 AM      Result Value Range Status   Specimen Description BLOOD RIGHT ARM   Final   Special Requests BOTTLES DRAWN AEROBIC AND ANAEROBIC 5CC   Final   Culture  Setup Time     Final   Value: 07/25/2013 13:17     Performed at [facility]   Culture     Final   Value:        BLOOD CULTURE RECEIVED NO GROWTH TO DATE CULTURE WILL BE HELD FOR 5 DAYS BEFORE ISSUING A FINAL NEGATIVE REPORT     Performed at [facility]   Report Status PENDING   Incomplete     Studies:  Recent x-ray studies have been reviewed in detail by the Attending Physician  Time spent : 83mins     Allison Ellis, ANP [facility] Office  [phone] Pager [phone]  **If unable to reach the above provider after paging please contact the [place] @ [phone]  On-Call/Text Page:      [email or web address]      password [email or web address]  If 7PM-7AM, please contact night-coverage [email or web address] Password [email or web address] 07/26/2013, 1:57 PM   LOS: 4 days   I have examined the patient, reviewed the  chart and modified the above note which I agree with.   Aubreana Cornacchia,MD [id number] 07/26/2013, 5:15 PM

## 2013-07-26 NOTE — Progress Notes (Signed)
     Subjective:  Patient reports pain as mild.  No new complaints.  Objective:   VITALS:   Filed Vitals:   07/25/13 2245 07/26/13 0000 07/26/13 0345 07/26/13 0800  BP: 133/62 139/68 155/70 142/75  Pulse: 99 96 97 88  Temp:  97.8 F (36.6 C) 97.9 F (36.6 C) 98.6 F (37 C)  TempSrc:  Oral Oral Oral  Resp: 20 18 22 17   Height:      Weight:      SpO2: 96% 92% 94% 94%    Neurologically intact Dorsiflexion/Plantar flexion intact   Lab Results  Component Value Date   WBC 22.9* 07/26/2013   HGB 8.9* 07/26/2013   HCT 26.7* 07/26/2013   MCV 91.8 07/26/2013   PLT 176 07/26/2013     Assessment/Plan: 4 Days Post-Op   Active Problems:   BRONCHIECTASIS   Essential and other specified forms of tremor   Closed intertrochanteric fracture of left hip   Leukocytosis   Thoracic compression fracture   Dehydration   Hypotension   FTT (failure to thrive) in adult   CKD (chronic kidney disease), stage III   AKI (acute kidney injury)   Hyperkalemia   Postoperative anemia due to acute blood loss   Advance diet Up with therapy Discharge to SNF Leukocytosis improved.  OK for discharge from my standpoint.  RTC 2 weeks, lovenox for dvt prophylaxis x 3 weeks, reduced dosing per weight and renal function.  Please call with further questions.  Wbat.  Tylenol for pain.    Braidan Ricciardi P 07/26/2013, 10:05 AM   Marchia Bond, MD Cell [phone]

## 2013-07-27 DIAGNOSIS — R627 Adult failure to thrive: Secondary | ICD-10-CM

## 2013-07-27 LAB — BASIC METABOLIC PANEL
BUN: 56 mg/dL — AB (ref 6–23)
CHLORIDE: 111 meq/L (ref 96–112)
CO2: 24 mEq/L (ref 19–32)
Calcium: 8.6 mg/dL (ref 8.4–10.5)
Creatinine, Ser: 1.15 mg/dL — ABNORMAL HIGH (ref 0.50–1.10)
GFR calc Af Amer: 47 mL/min — ABNORMAL LOW (ref >90)
GFR calc non Af Amer: 41 mL/min — ABNORMAL LOW (ref >90)
Glucose, Bld: 130 mg/dL — ABNORMAL HIGH (ref 70–99)
Potassium: 4.5 mEq/L (ref 3.7–5.3)
Sodium: 146 mEq/L (ref 137–147)

## 2013-07-27 LAB — CBC
HEMATOCRIT: 30 % — AB (ref 36.0–46.0)
Hemoglobin: 9.5 g/dL — ABNORMAL LOW (ref 12.0–15.0)
MCH: 29.6 pg (ref 26.0–34.0)
MCHC: 31.7 g/dL (ref 30.0–36.0)
MCV: 93.5 fL (ref 78.0–100.0)
Platelets: 226 10*3/uL (ref 150–400)
RBC: 3.21 MIL/uL — ABNORMAL LOW (ref 3.87–5.11)
RDW: 16.3 % — ABNORMAL HIGH (ref 11.5–15.5)
WBC: 21.6 10*3/uL — ABNORMAL HIGH (ref 4.0–10.5)

## 2013-07-27 MED ORDER — ENOXAPARIN SODIUM 30 MG/0.3ML ~~LOC~~ SOLN: 30.0000 mg | SUBCUTANEOUS | Status: AC

## 2013-07-27 MED ORDER — POLYETHYLENE GLYCOL 3350 17 G PO PACK: 17.0000 g | PACK | Freq: Every day | ORAL | Status: AC | PRN

## 2013-07-27 MED ORDER — HYDROCODONE-ACETAMINOPHEN 5-325 MG PO TABS: 1.0000 | ORAL_TABLET | Freq: Four times a day (QID) | ORAL | Status: AC | PRN

## 2013-07-27 MED ORDER — ASPIRIN 81 MG PO CHEW: 81.0000 mg | CHEWABLE_TABLET | Freq: Every day | ORAL | Status: AC

## 2013-07-27 MED ORDER — SENNOSIDES-DOCUSATE SODIUM 8.6-50 MG PO TABS
1.0000 | ORAL_TABLET | Freq: Every evening | ORAL | Status: AC | PRN
Start: 2013-07-27 — End: ?

## 2013-07-27 MED ORDER — BOOST / RESOURCE BREEZE PO LIQD: 1.0000 | Freq: Two times a day (BID) | ORAL | Status: AC

## 2013-07-27 MED ORDER — BISACODYL 10 MG RE SUPP: 10.0000 mg | Freq: Every day | RECTAL | Status: AC | PRN

## 2013-07-27 MED ORDER — SENNOSIDES-DOCUSATE SODIUM 8.6-50 MG PO TABS: 1.0000 | ORAL_TABLET | Freq: Two times a day (BID) | ORAL | Status: AC

## 2013-07-27 NOTE — Discharge Summary (Signed)
Physician Discharge Summary  Nancy Ponce:[id number] DOB: 1921/06/11 DOA: 07/22/2013  PCP: Mathews Argyle, MD  Admit date: 07/22/2013 Discharge date: 07/27/2013  Time spent: 30 minutes  Recommendations for Outpatient Follow-up:  1. Recommend discuss with family re: goals of care and consider completion of MOST form if not yet done. 2. Follow up with PCP or facility MD re: hospital follow up-  repeat CBC and BMET in one week 3. Follow up with Dr. Mardelle Matte as scheduled  Discharge Diagnoses:  Active Problems:   Closed intertrochanteric fracture of left hip-post Left intramedullary nail/intertrochanteric for left fracture    Dehydration   Postoperative anemia due to acute blood loss s/p transfusion   Leukocytosis resolving/not due to infection   Hypotension-resolved   CKD (chronic kidney disease), stage III   AKI (acute kidney injury)-resolving   Hyperkalemia-resolved   Thoracic compression fracture   FTT (failure to thrive) in adult/chronic recurrent dehydration and aspiration   BRONCHIECTASIS/chronic and likely due to recurrent aspiration   Essential and other specified forms of tremor   Dysphagia on nectar thick liquids   Discharge Condition: stable  Diet recommendation: Dysphagia 3 with nectar thick liquids  Filed Weights   07/23/13 0100  Weight: 94 lb 9.2 oz (42.9 kg)    History of present illness:  80 year old female patient resident of assisted-living facility primarily wheelchair-bound. Apparently was in her wheelchair when she lunged forward and fell. Was noted to have deformity of left leg. She is able to ambulate with a walker with direct supervision and must be assisted to get up to the standing position.   Upon evaluation in ER she was found to have mild leukocytosis and x-ray confirmed hip fracture. Orthopedic service was consulted.   Hospital Course:  Closed intertrochanteric fracture of left hip  -Stable postop  -Per Orthopedic team from a  postoperative standpoint ready for discharge  -continue Lovenox for DVT prophylaxis with baby ASA for two more weeks -she has been using Vicodin for pain  Dehydration  -Suspect was somewhat dehydrated prior to her fall and stress of surgery further exacerbated  -Continued IV fluid but since did receive blood decreased rate from 125 to 75 to 50 then to KVO-unfortunately UOP dropped off and BUN/Cr increased on 1/27 so IVFs increased back to 75  -BP up 1/28 so KVO'd fluids  -repeated UA which was still c/w low volume- pt drinks total of 3 sippy cups full (each about 240-300 cc) -CPK 34 so no rhabdomylosis -on date of discharge BUN/Cr was 56/1.15 -dtr made aware needs to encourage pt to drink- does best with sippy cup  Postoperative anemia due to acute blood loss  -Combination of blood loss from surgery as well as hemodilution after rehydration  -post 2 units PRBCs 1/26  - suspect dilutional component and may have been quite low pre admit and was masked by severe DH  -Hgb 9.5 on date of discharge  HTN  -dc'd Midodrine since hypertension -continue Toprol XL   Leukocytosis  - UA/cx was negative x 2 this admit -suspect due to continued aspiration  -Trended up without fever- since was up to >30,000 we repeated UA and PCXR which were not c/w infectious etiology - blood cx's NGTD  -WBC was 21,600 on date of discharge -no distant baseline available   Hypotension  -resolved  -on chronic Midodrine- also was on Toprol XL- have dc'd Midodrine (see above)   CKD stage III / AKI (acute kidney injury)  -hypoperfusion/hypotension related - finally  improving - no offending meds and did not receive IV contrast-UA seems c/w mild ATN- CPK though was only 34   Hyperkalemia -resolved  -Secondary to acute kidney injury and dehydration   Thoracic compression fracture   FTT (failure to thrive) in adult  -According to patient's daughter has had progressive functional decline over the past year most  notably since Thanksgiving of 2014 -suspect she is nearing EOL and family would benefit from definitive goals of care and recommend completion of MOST form if not yet done. -Currently is DO NOT RESUSCITATE   BRONCHIECTASIS / mild hypoxemic respiratory failure  -still requires low dose oxygen at 1 L via [place] -Based on chest x-ray appears to be chronic and primarily on right side could be sign of chronic aspiration  -SLP evaluated for appropriate diet consistency (D3/nectar thick) but would not pursue MBSS at this time   Essential tremor   Procedures: Left intramedullary nail/intertrochanteric for left fracture Dr. Mardelle Matte   Consultations:  [facility]/Orthopedics  Discharge Exam: Filed Vitals:   07/27/13 0544  BP: 157/64  Pulse: 98  Temp: 97.9 F (36.6 C)  Resp: 18   General: No acute respiratory distress at rest  Lungs: Coarse to auscultation bilaterally without wheezes or crackles, decreased bases, 1 L upper airway congestion  Cardiovascular: Regular rate and rhythm without murmur gallop or rub normal S1 and S2, no peripheral edema or JVD  Abdomen: Nontender, nondistended, soft, bowel sounds positive, no rebound, no ascites, no appreciable mass  Musculoskeletal: No significant cyanosis, clubbing of bilateral lower extremities  Neurological: Awake and oriented x name only, moves all extremities x 4 without focal neurological deficits of the left leg slightly weaker due to recent hip fracture repair and expected postoperative pain    Discharge Instructions      Discharge Orders   Future Appointments Provider Department Dept Phone   10/18/2013 11:00 AM Kathrynn Ducking, MD [facility] [phone]   Future Orders Complete By Expires   Diet general  As directed    Scheduling Instructions:     Dysphagia 3 with nectar thick liquids- use sippy cup   Discharge instructions  As directed    Comments:     Continue oxygen via  at 1-2 L to keep sats >/= 90%    Increase activity slowly  As directed    Weight bearing as tolerated  As directed    Questions:     Laterality:     Extremity:         Medication List    STOP taking these medications       midodrine 2.5 MG tablet  Commonly known as:  PROAMATINE      TAKE these medications       acetaminophen 325 MG tablet  Commonly known as:  TYLENOL  Take 2 tablets (650 mg total) by mouth every 6 (six) hours as needed.     ALPRAZolam 0.25 MG tablet  Commonly known as:  XANAX  Take 0.125 mg by mouth 3 (three) times daily.     aspirin 81 MG tablet  Take 81 mg by mouth daily.     aspirin 81 MG chewable tablet  Chew 1 tablet (81 mg total) by mouth daily.     bisacodyl 10 MG suppository  Commonly known as:  DULCOLAX  Place 1 suppository (10 mg total) rectally daily as needed for moderate constipation.     enoxaparin 30 MG/0.3ML injection  Commonly known as:  LOVENOX  Inject 0.3 mLs (30  mg total) into the skin daily.     enoxaparin 30 MG/0.3ML injection  Commonly known as:  LOVENOX  Inject 0.3 mLs (30 mg total) into the skin daily.     feeding supplement (RESOURCE BREEZE) Liqd  Take 1 Container by mouth 2 (two) times daily between meals.     guaiFENesin 100 MG/5ML liquid  Commonly known as:  ROBITUSSIN  Take 300 mg by mouth every 4 (four) hours as needed for cough.     HYDROcodone-acetaminophen 5-325 MG per tablet  Commonly known as:  NORCO/VICODIN  Take 1 tablet by mouth every 6 (six) hours as needed for moderate pain.     megestrol 400 MG/10ML suspension  Commonly known as:  MEGACE  Take 200 mg by mouth daily.     metoprolol succinate 25 MG 24 hr tablet  Commonly known as:  TOPROL XL  Take 1 tablet (25 mg total) by mouth at bedtime.     mirtazapine 15 MG tablet  Commonly known as:  REMERON  Take 15 mg by mouth at bedtime.     polyethylene glycol packet  Commonly known as:  MIRALAX / GLYCOLAX  Take 17 g by mouth daily as needed for mild constipation.      senna-docusate 8.6-50 MG per tablet  Commonly known as:  Senokot-S  Take 1 tablet by mouth at bedtime as needed for mild constipation.     senna-docusate 8.6-50 MG per tablet  Commonly known as:  Senokot-S  Take 1 tablet by mouth 2 (two) times daily.     sertraline 50 MG tablet  Commonly known as:  ZOLOFT  Take 50 mg by mouth daily.       Allergies  Allergen Reactions  . Codeine Other (See Comments)    Per MAR  . Sulfa Antibiotics Other (See Comments)    Per MAR   . Urecholine [Bethanechol] Other (See Comments)    Per MAR    Follow-up Information   Follow up with Johnny Bridge, MD. Schedule an appointment as soon as possible for a visit in 2 weeks.   Specialty:  Orthopedic Surgery   Contact information:   [address] [address] [phone]        The results of significant diagnostics from this hospitalization (including imaging, microbiology, ancillary and laboratory) are listed below for reference.    Significant Diagnostic Studies: Dg Chest 1 View  07/22/2013   CLINICAL DATA:  Fall, left hip fracture  EXAM: CHEST - 1 VIEW  COMPARISON:  03/15/2013  FINDINGS: Stable mild cardiomegaly with central vascular congestion. Hyperinflation noted compatible with background COPD/ emphysema. Patchy and nodular airspace opacities bilaterally in the right mid lung, both hilar regions, and the lower lobes. Some of these opacities correlate with areas of known bronchiectasis in the right middle lobe, lingula and left lower lobe with chronic mucous plugging. No superimposed edema. No effusion or pneumothorax. Right upper lobe midlung nodule noted measures 15 mm appearing new since the prior study.  IMPRESSION: Cardiomegaly with vascular congestion  Chronic right middle lobe, lingula, and basilar opacities correlating with known bronchiectasis and probable increased mucus plugging  New right upper lobe 15 mm nodular opacity versus nodule. Consider nonemergent  follow-up chest CT for further evaluation  No edema, effusion or pneumothorax   Electronically Signed   By: Daryll Brod M.D.   On: 07/22/2013 17:12   Dg Hip Complete Left  07/22/2013   CLINICAL DATA:  Fall, left hip pain  EXAM:  LEFT HIP - COMPLETE 2+ VIEW  COMPARISON:  None.  FINDINGS: There is an acute displaced left hip intertrochanteric fracture with angulation. No associated subluxation or dislocation of the femoral head. Bones appear osteopenic. Right hip intact. Nondisplaced acute fractures also noted of the left superior and inferior rami.  IMPRESSION: Acute displaced and angulated left hip intertrochanteric fracture.  Left superior and inferior rami fractures  Osteopenia   Electronically Signed   By: Daryll Brod M.D.   On: 07/22/2013 17:07   Dg Femur Left  07/23/2013   CLINICAL DATA:  Left hip fracture ORIF  EXAM: LEFT FEMUR - 2 VIEW  COMPARISON:  07/22/2013  FINDINGS: Spot fluoroscopic intraoperative views demonstrate left femur intramedullary rod and pin fixation of the intertrochanteric fracture. Anatomic alignment. Fracture lines remain visible. No subluxation or dislocation.  IMPRESSION: Status post ORIF of the left hip intertrochanteric fracture with anatomic alignment. No complicating feature.   Electronically Signed   By: Daryll Brod M.D.   On: 07/23/2013 01:14   Ct Head Wo Contrast  07/22/2013   CLINICAL DATA:  Fall.  Vomiting after fall.  EXAM: CT HEAD WITHOUT CONTRAST  CT CERVICAL SPINE WITHOUT CONTRAST  TECHNIQUE: Multidetector CT imaging of the head and cervical spine was performed following the standard protocol without intravenous contrast. Multiplanar CT image reconstructions of the cervical spine were also generated.  COMPARISON:  10/13/2005 cervical spine MR. 02/02/2005 brain MR.  FINDINGS: CT HEAD FINDINGS  Fluid level within the sphenoid sinus however, no adjacent skullbase fracture detected. Partial opacification posterior ethmoid sinus air cells.  No intracranial  hemorrhage.  Global atrophy. Ventricular prominence felt to be related to atrophy rather than hydrocephalus.  Small vessel disease type changes without CT evidence of large acute infarct.  No intracranial mass lesion noted on this unenhanced exam.  Right parotid 1.4 cm lesion without significant change since 2006. Etiology indeterminate.  CT CERVICAL SPINE FINDINGS  No cervical spine fracture. Cervical spondylotic changes. Transverse ligament hypertrophy. No abnormal prevertebral soft tissue swelling.  New from the 2007 MR is a T1 superior endplate mild compression fracture with 15% loss of height anteriorly. Age of this is indeterminate.  IMPRESSION: Head CT:  No skull fracture or intracranial hemorrhage.  Global atrophy.  Small vessel disease type changes.  Sphenoid sinus air-fluid level may indicate acute sinusitis. Partial opacification posterior ethmoid sinus air cells.  1.4 cm right parotid lesion unchanged since 2006. Etiology indeterminate.  Cervical spine CT:  No cervical spine fracture.  New from the 2007 MR is a T1 superior endplate mild compression fracture with 15% loss of height anteriorly. Age of this is indeterminate. Acute injury cannot be excluded. If further delineation is clinically desired, cervical spine MR including the upper thoracic spine can be obtained.  These results were called by telephone at the time of interpretation on 07/22/2013 at 4:52 PM to [facility] , who verbally acknowledged these results.   Electronically Signed   By: Chauncey Cruel M.D.   On: 07/22/2013 16:54   Ct Cervical Spine Wo Contrast  07/22/2013   CLINICAL DATA:  Fall.  Vomiting after fall.  EXAM: CT HEAD WITHOUT CONTRAST  CT CERVICAL SPINE WITHOUT CONTRAST  TECHNIQUE: Multidetector CT imaging of the head and cervical spine was performed following the standard protocol without intravenous contrast. Multiplanar CT image reconstructions of the cervical spine were also generated.  COMPARISON:  10/13/2005 cervical  spine MR. 02/02/2005 brain MR.  FINDINGS: CT HEAD FINDINGS  Fluid level within the sphenoid  sinus however, no adjacent skullbase fracture detected. Partial opacification posterior ethmoid sinus air cells.  No intracranial hemorrhage.  Global atrophy. Ventricular prominence felt to be related to atrophy rather than hydrocephalus.  Small vessel disease type changes without CT evidence of large acute infarct.  No intracranial mass lesion noted on this unenhanced exam.  Right parotid 1.4 cm lesion without significant change since 2006. Etiology indeterminate.  CT CERVICAL SPINE FINDINGS  No cervical spine fracture. Cervical spondylotic changes. Transverse ligament hypertrophy. No abnormal prevertebral soft tissue swelling.  New from the 2007 MR is a T1 superior endplate mild compression fracture with 15% loss of height anteriorly. Age of this is indeterminate.  IMPRESSION: Head CT:  No skull fracture or intracranial hemorrhage.  Global atrophy.  Small vessel disease type changes.  Sphenoid sinus air-fluid level may indicate acute sinusitis. Partial opacification posterior ethmoid sinus air cells.  1.4 cm right parotid lesion unchanged since 2006. Etiology indeterminate.  Cervical spine CT:  No cervical spine fracture.  New from the 2007 MR is a T1 superior endplate mild compression fracture with 15% loss of height anteriorly. Age of this is indeterminate. Acute injury cannot be excluded. If further delineation is clinically desired, cervical spine MR including the upper thoracic spine can be obtained.  These results were called by telephone at the time of interpretation on 07/22/2013 at 4:52 PM to [facility] , who verbally acknowledged these results.   Electronically Signed   By: Chauncey Cruel M.D.   On: 07/22/2013 16:54   Dg Pelvis Portable  07/23/2013   CLINICAL DATA:  Left hip intertrochanteric fracture, status post reduction and operative fixation  EXAM: PORTABLE PELVIS 1-2 VIEWS  COMPARISON:  07/22/2013 .   FINDINGS: left hip intertrochanteric fracture has been reduced with an intra medullary rod and a proximal fixation screw. Improved left hip alignment. Displaced fracture remains along the lesser trochanter. Nondisplaced left superior and inferior rami fractures noted.  IMPRESSION: Status post ORIF with improved alignment of the left hip intertrochanteric fracture. Expected postoperative appearance   Electronically Signed   By: Daryll Brod M.D.   On: 07/23/2013 01:18   Dg Chest Port 1 View  07/25/2013   CLINICAL DATA:  Unexplained leukocytosis, suspect recurrent aspiration  EXAM: PORTABLE CHEST - 1 VIEW  COMPARISON:  Prior chest x-ray 07/23/2013  FINDINGS: Stable moderate cardiomegaly. Atherosclerotic and tortuous thoracic aorta. Increased pulmonary vascular congestion and diffuse a bilateral interstitial opacities concerning for mild edema. Increased bibasilar opacities. No pleural effusion or pneumothorax. Stable background nodular opacities and regions of pleural parenchymal scarring. No acute osseous abnormality.  IMPRESSION: 1. Increased pulmonary vascular congestion now with mild interstitial edema. 2. Increasing bibasilar opacities could reflect is small volume aspiration, atelectasis, or dependent edema. 3. Otherwise, stable chronic cardiopulmonary changes.   Electronically Signed   By: Jacqulynn Cadet M.D.   On: 07/25/2013 08:38   Dg Chest Port 1 View  07/23/2013   CLINICAL DATA:  Hypoxia  EXAM: PORTABLE CHEST - 1 VIEW  COMPARISON:  DG CHEST 1 VIEW dated 07/22/2013; DG CHEST 2 VIEW dated 03/15/2013; DG CHEST 2V dated 10/22/2010; CT CHEST W/O CM dated 08/15/2008  FINDINGS: Grossly unchanged cardiac silhouette and mediastinal contours with atherosclerotic plaque within the thoracic aorta. The lungs remain hyperexpanded with mild diffuse reticular opacities with relative areas of confluence about the right perihilar lung and medial aspect of the left lower lung. Ill-defined opacities within the peripheral  aspect of the bilateral mid lungs are grossly unchanged. No definite  pleural effusion or pneumothorax. Grossly unchanged bones.  IMPRESSION: 1. Grossly unchanged findings lung hyperexpansion and chronic right middle lobe, lingular and bibasilar opacities likely compatible with provided history of bronchiectasis. No new focal airspace opacities. 2. Ill-defined nodular opacities within the right upper/mid and peripheral aspect of the left mid lung are grossly unchanged. Imaging strategies include a follow-up chest radiograph 4 to 6 weeks after treatment versus nonemergent chest CT.   Electronically Signed   By: Sandi Mariscal M.D.   On: 07/23/2013 08:51   Dg Hip Portable 1 View Left  07/23/2013   CLINICAL DATA:  Left hip intertrochanteric fracture  EXAM: PORTABLE LEFT HIP - 1 VIEW; DG C-ARM 1-60 MIN  COMPARISON:  07/22/2013  FINDINGS: Cross-table lateral view demonstrates anatomic alignment following ORIF of the left hip intertrochanteric fracture. No complicating feature.  IMPRESSION: Improved alignment following left hip ORIF   Electronically Signed   By: Daryll Brod M.D.   On: 07/23/2013 01:19   Dg C-arm 1-60 Min  07/23/2013   CLINICAL DATA:  Left hip intertrochanteric fracture  EXAM: PORTABLE LEFT HIP - 1 VIEW; DG C-ARM 1-60 MIN  COMPARISON:  07/22/2013  FINDINGS: Cross-table lateral view demonstrates anatomic alignment following ORIF of the left hip intertrochanteric fracture. No complicating feature.  IMPRESSION: Improved alignment following left hip ORIF   Electronically Signed   By: Daryll Brod M.D.   On: 07/23/2013 01:19    Microbiology: Recent Results (from the past 240 hour(s))  SURGICAL PCR SCREEN     Status: None   Collection Time    07/22/13  8:22 PM      Result Value Range Status   MRSA, PCR NEGATIVE  NEGATIVE Final   Staphylococcus aureus NEGATIVE  NEGATIVE Final   Comment:            The Xpert SA Assay (FDA     approved for NASAL specimens     in patients over 24 years of age),      is one component of     a comprehensive surveillance     program.  Test performance has     been validated by [employer] for patients greater     than or equal to 36 year old.     It is not intended     to diagnose infection nor to     guide or monitor treatment.  MRSA PCR SCREENING     Status: None   Collection Time    07/23/13  5:08 AM      Result Value Range Status   MRSA by PCR NEGATIVE  NEGATIVE Final   Comment:            The GeneXpert MRSA Assay (FDA     approved for NASAL specimens     only), is one component of a     comprehensive MRSA colonization     surveillance program. It is not     intended to diagnose MRSA     infection nor to guide or     monitor treatment for     MRSA infections.  URINE CULTURE     Status: None   Collection Time    07/23/13 12:06 PM      Result Value Range Status   Specimen Description URINE, CATHETERIZED   Final   Special Requests NONE   Final   Culture  Setup Time     Final   Value: 07/23/2013 17:19  Performed at [place]     Final   Value: NO GROWTH     Performed at [facility]   Culture     Final   Value: NO GROWTH     Performed at [facility]   Report Status 07/24/2013 FINAL   Final  CULTURE, BLOOD (ROUTINE X 2)     Status: None   Collection Time    07/25/13  8:35 AM      Result Value Range Status   Specimen Description BLOOD LEFT HAND   Final   Special Requests BOTTLES DRAWN AEROBIC ONLY 3CC   Final   Culture  Setup Time     Final   Value: 07/25/2013 13:17     Performed at [facility]   Culture     Final   Value:        BLOOD CULTURE RECEIVED NO GROWTH TO DATE CULTURE WILL BE HELD FOR 5 DAYS BEFORE ISSUING A FINAL NEGATIVE REPORT     Performed at [facility]   Report Status PENDING   Incomplete  CULTURE, BLOOD (ROUTINE X 2)     Status: None   Collection Time    07/25/13  8:40 AM      Result Value Range Status   Specimen Description BLOOD RIGHT ARM    Final   Special Requests BOTTLES DRAWN AEROBIC AND ANAEROBIC 5CC   Final   Culture  Setup Time     Final   Value: 07/25/2013 13:17     Performed at [facility]   Culture     Final   Value:        BLOOD CULTURE RECEIVED NO GROWTH TO DATE CULTURE WILL BE HELD FOR 5 DAYS BEFORE ISSUING A FINAL NEGATIVE REPORT     Performed at [facility]   Report Status PENDING   Incomplete     Labs: Basic Metabolic Panel:  Recent Labs Lab 07/23/13 0308 07/24/13 0325 07/25/13 0315 07/26/13 0252 07/27/13 0810  NA 137 136* 143 143 146  K 5.6* 5.3 4.9 4.6 4.5  CL 97 100 109 107 111  CO2 25 25 24 26 24   GLUCOSE 167* 103* 112* 152* 130*  BUN 35* 50* 48* 53* 56*  CREATININE 1.40*  1.39* 2.15* 1.44* 1.29* 1.15*  CALCIUM 8.7 8.0* 8.0* 8.3* 8.6   Liver Function Tests: No results found for this basename: AST, ALT, ALKPHOS, BILITOT, PROT, ALBUMIN,  in the last 168 hours No results found for this basename: LIPASE, AMYLASE,  in the last 168 hours No results found for this basename: AMMONIA,  in the last 168 hours CBC:  Recent Labs Lab 07/22/13 1550 07/23/13 0308 07/24/13 0325 07/25/13 0315 07/26/13 0252 07/27/13 0810  WBC 16.3* 27.2* 24.0* 30.7* 22.9* 21.6*  NEUTROABS 13.0*  --   --   --   --   --   HGB 10.4* 8.1* 7.8* 9.4* 8.9* 9.5*  HCT 30.5* 25.0* 22.8* 27.7* 26.7* 30.0*  MCV 93.3 94.7 89.8 89.4 91.8 93.5  PLT 302 216 175 155 176 226   Cardiac Enzymes:  Recent Labs Lab 07/22/13 0005 07/26/13 1122  CKTOTAL  --  34  TROPONINI <0.30  --    BNP: BNP (last 3 results) No results found for this basename: PROBNP,  in the last 8760 hours CBG: No results found for this basename: GLUCAP,  in the last 168 hours     Signed:  ELLIS,ALLISON L.  ANP  [facility] 07/27/2013, 10:46 AM   I have examined the patient, reviewed the chart and modified the above note which I agree with.   Kona Yusuf,MD [id number] 07/27/2013, 5:52 PM

## 2013-07-27 NOTE — Plan of Care (Cosign Needed)
Plan to dc to SNF today- dc summary to follow.  [name], ANP

## 2013-07-30 ENCOUNTER — Encounter (HOSPITAL_COMMUNITY): Payer: Self-pay | Admitting: Orthopedic Surgery

## 2013-07-31 LAB — CULTURE, BLOOD (ROUTINE X 2)
CULTURE: NO GROWTH
Culture: NO GROWTH

## 2013-08-26 DEATH — deceased

## 2013-10-18 ENCOUNTER — Ambulatory Visit: Payer: Medicare Other | Admitting: Neurology

## 2014-12-08 IMAGING — DX DG CHEST 1V PORT
1 series · 1 of 1 positions shown · non-contrast
Comparison: Prior chest x-ray 07/23/2013

CLINICAL DATA: Unexplained leukocytosis, suspect recurrent
aspiration

EXAM:
PORTABLE CHEST - 1 VIEW

[portable]
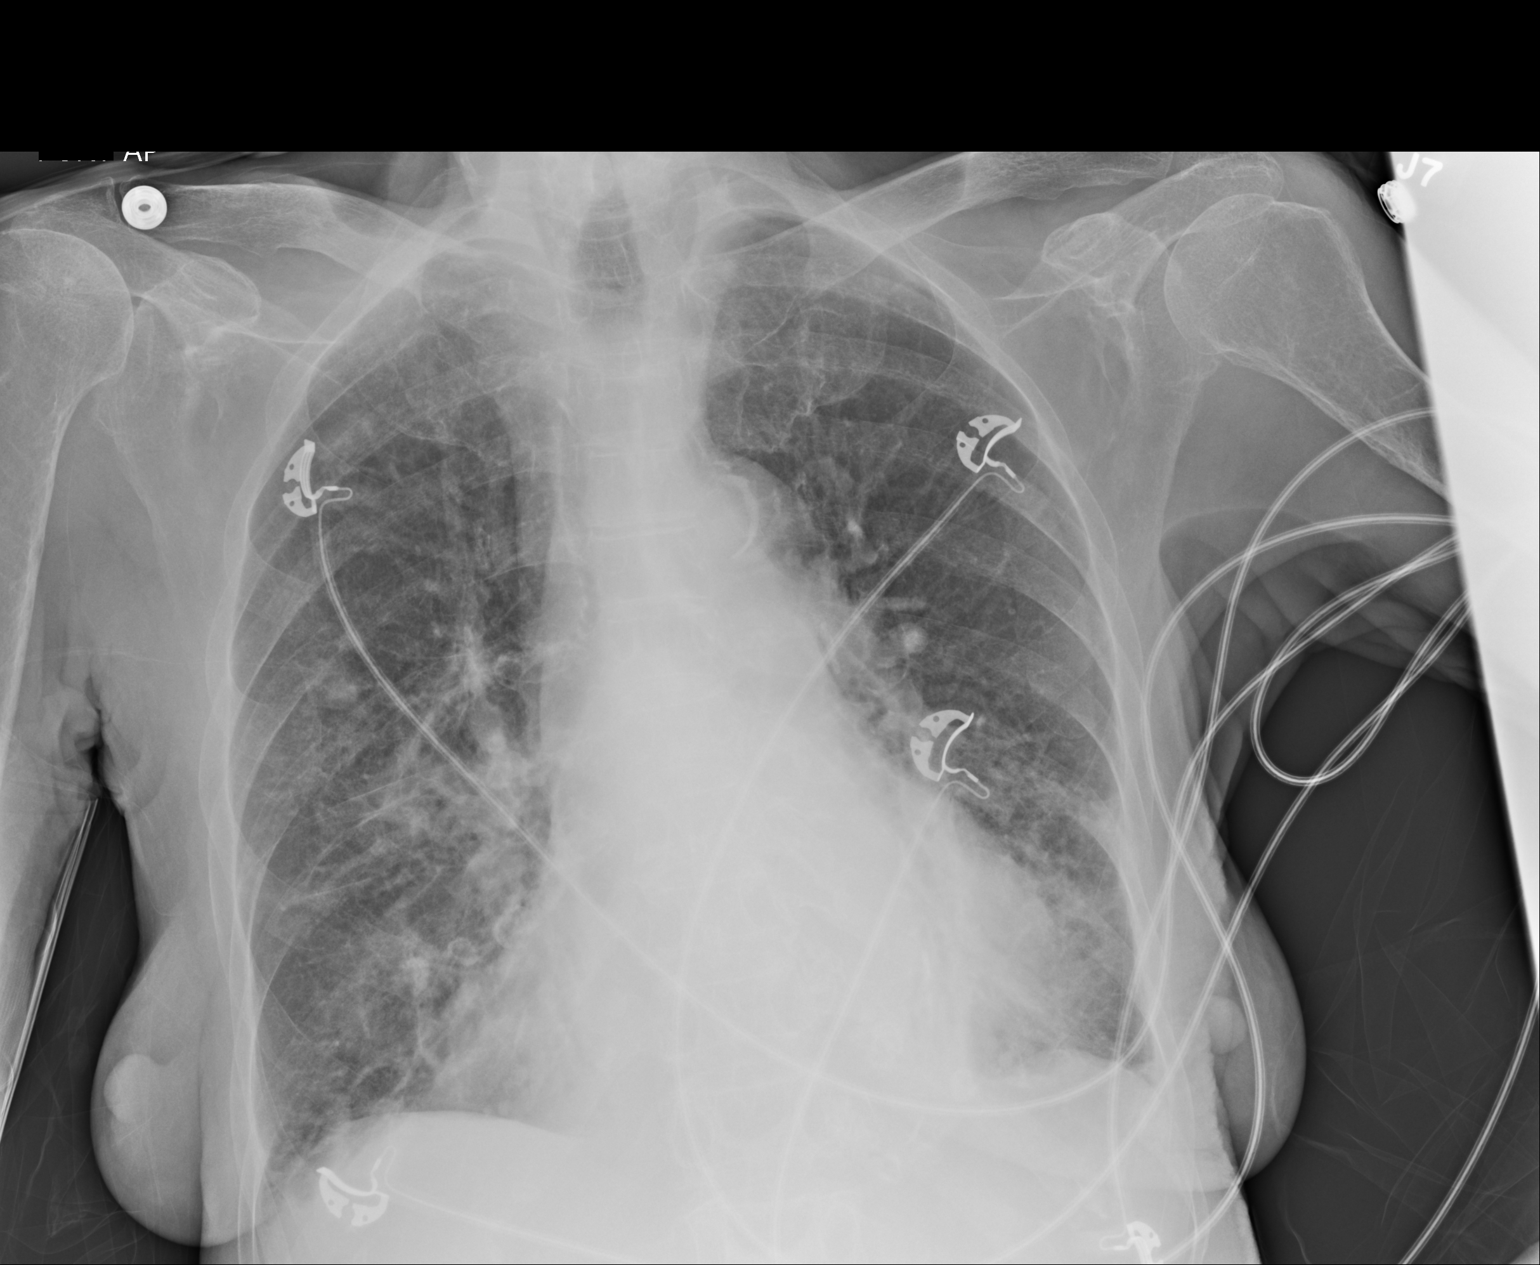

[1 of 1 positions shown; findings below may reference images not displayed]

FINDINGS: Stable moderate cardiomegaly. Atherosclerotic and tortuous thoracic
aorta. Increased pulmonary vascular congestion and diffuse a
bilateral interstitial opacities concerning for mild edema.
Increased bibasilar opacities. No pleural effusion or pneumothorax.
Stable background nodular opacities and regions of pleural
parenchymal scarring. No acute osseous abnormality.
IMPRESSION: 1. Increased pulmonary vascular congestion now with mild
interstitial edema.
2. Increasing bibasilar opacities could reflect is small volume
aspiration, atelectasis, or dependent edema.
3. Otherwise, stable chronic cardiopulmonary changes.
